# Patient Record
Sex: Female | Born: 1955 | Race: White | Hispanic: No | Marital: Married | Smoking: Never smoker
Health system: Southern US, Community
[De-identification: ages and names within clinical notes are randomized; demographics above are authoritative.]

## PROBLEM LIST (undated history)

## (undated) DIAGNOSIS — K222 Esophageal obstruction: Secondary | ICD-10-CM

## (undated) DIAGNOSIS — E559 Vitamin D deficiency, unspecified: Secondary | ICD-10-CM

## (undated) DIAGNOSIS — K5790 Diverticulosis of intestine, part unspecified, without perforation or abscess without bleeding: Secondary | ICD-10-CM

## (undated) DIAGNOSIS — M810 Age-related osteoporosis without current pathological fracture: Secondary | ICD-10-CM

## (undated) DIAGNOSIS — K219 Gastro-esophageal reflux disease without esophagitis: Secondary | ICD-10-CM

## (undated) DIAGNOSIS — E785 Hyperlipidemia, unspecified: Secondary | ICD-10-CM

## (undated) HISTORY — DX: Esophageal obstruction: K22.2

## (undated) HISTORY — PX: ABDOMINAL HYSTERECTOMY: SHX81

## (undated) HISTORY — PX: WISDOM TOOTH EXTRACTION: SHX21

## (undated) HISTORY — PX: TONSILLECTOMY: SUR1361

## (undated) HISTORY — DX: Age-related osteoporosis without current pathological fracture: M81.0

## (undated) HISTORY — DX: Gastro-esophageal reflux disease without esophagitis: K21.9

## (undated) HISTORY — DX: Hyperlipidemia, unspecified: E78.5

## (undated) HISTORY — DX: Vitamin D deficiency, unspecified: E55.9

## (undated) HISTORY — PX: TONSILLECTOMY AND ADENOIDECTOMY: SUR1326

## (undated) HISTORY — DX: Diverticulosis of intestine, part unspecified, without perforation or abscess without bleeding: K57.90

---

## 1999-11-22 ENCOUNTER — Ambulatory Visit (HOSPITAL_COMMUNITY): Admission: RE | Admit: 1999-11-22 | Discharge: 1999-11-22 | Payer: Self-pay | Admitting: Obstetrics and Gynecology

## 1999-11-22 ENCOUNTER — Encounter: Payer: Self-pay | Admitting: Obstetrics and Gynecology

## 2001-10-29 ENCOUNTER — Ambulatory Visit (HOSPITAL_COMMUNITY): Admission: RE | Admit: 2001-10-29 | Discharge: 2001-10-29 | Payer: Self-pay | Admitting: Obstetrics and Gynecology

## 2001-10-29 ENCOUNTER — Encounter: Payer: Self-pay | Admitting: Obstetrics and Gynecology

## 2002-11-25 ENCOUNTER — Encounter: Payer: Self-pay | Admitting: Internal Medicine

## 2002-11-25 ENCOUNTER — Encounter: Payer: Self-pay | Admitting: Obstetrics and Gynecology

## 2002-11-25 ENCOUNTER — Ambulatory Visit (HOSPITAL_COMMUNITY): Admission: RE | Admit: 2002-11-25 | Discharge: 2002-11-25 | Payer: Self-pay | Admitting: Obstetrics and Gynecology

## 2003-04-24 ENCOUNTER — Encounter: Payer: Self-pay | Admitting: Internal Medicine

## 2003-12-08 ENCOUNTER — Ambulatory Visit (HOSPITAL_COMMUNITY): Admission: RE | Admit: 2003-12-08 | Discharge: 2003-12-08 | Payer: Self-pay | Admitting: Obstetrics and Gynecology

## 2010-10-04 ENCOUNTER — Ambulatory Visit: Payer: Self-pay | Admitting: Internal Medicine

## 2010-10-04 ENCOUNTER — Encounter: Payer: Self-pay | Admitting: Internal Medicine

## 2010-10-04 DIAGNOSIS — M810 Age-related osteoporosis without current pathological fracture: Secondary | ICD-10-CM | POA: Insufficient documentation

## 2010-10-04 DIAGNOSIS — E785 Hyperlipidemia, unspecified: Secondary | ICD-10-CM | POA: Insufficient documentation

## 2010-10-04 LAB — CONVERTED CEMR LAB
Cholesterol, target level: 200 mg/dL
HDL goal, serum: 40 mg/dL
LDL Goal: 160 mg/dL

## 2010-10-05 LAB — CONVERTED CEMR LAB
ALT: 27 units/L (ref 0–35)
AST: 29 units/L (ref 0–37)
Albumin: 4.3 g/dL (ref 3.5–5.2)
Alkaline Phosphatase: 55 units/L (ref 39–117)
BUN: 12 mg/dL (ref 6–23)
Basophils Absolute: 0.1 10*3/uL (ref 0.0–0.1)
Basophils Relative: 0.8 % (ref 0.0–3.0)
Bilirubin, Direct: 0.1 mg/dL (ref 0.0–0.3)
CO2: 29 meq/L (ref 19–32)
Calcium: 9.8 mg/dL (ref 8.4–10.5)
Chloride: 104 meq/L (ref 96–112)
Cholesterol: 232 mg/dL — ABNORMAL HIGH (ref 0–200)
Creatinine, Ser: 0.5 mg/dL (ref 0.4–1.2)
Direct LDL: 125.3 mg/dL
Eosinophils Absolute: 0.2 10*3/uL (ref 0.0–0.7)
Eosinophils Relative: 3.1 % (ref 0.0–5.0)
GFR calc non Af Amer: 132.72 mL/min (ref >60.00)
Glucose, Bld: 85 mg/dL (ref 70–99)
HCT: 39.2 % (ref 36.0–46.0)
HDL: 79.5 mg/dL (ref >39.00)
Hemoglobin: 13.4 g/dL (ref 12.0–15.0)
Lymphocytes Relative: 29.8 % (ref 12.0–46.0)
Lymphs Abs: 2.1 10*3/uL (ref 0.7–4.0)
MCHC: 34.2 g/dL (ref 30.0–36.0)
MCV: 94.9 fL (ref 78.0–100.0)
Monocytes Absolute: 0.6 10*3/uL (ref 0.1–1.0)
Monocytes Relative: 8 % (ref 3.0–12.0)
Neutro Abs: 4.1 10*3/uL (ref 1.4–7.7)
Neutrophils Relative %: 58.3 % (ref 43.0–77.0)
Platelets: 297 10*3/uL (ref 150.0–400.0)
Potassium: 4.4 meq/L (ref 3.5–5.1)
RBC: 4.13 M/uL (ref 3.87–5.11)
RDW: 13.9 % (ref 11.5–14.6)
Sodium: 143 meq/L (ref 135–145)
TSH: 0.9 microintl units/mL (ref 0.35–5.50)
Total Bilirubin: 0.5 mg/dL (ref 0.3–1.2)
Total CHOL/HDL Ratio: 3
Total Protein: 6.9 g/dL (ref 6.0–8.3)
Triglycerides: 41 mg/dL (ref 0.0–149.0)
VLDL: 8.2 mg/dL (ref 0.0–40.0)
WBC: 7 10*3/uL (ref 4.5–10.5)

## 2010-10-07 LAB — CONVERTED CEMR LAB: Vit D, 25-Hydroxy: 51 ng/mL (ref 30–89)

## 2010-10-23 HISTORY — PX: COLONOSCOPY: SHX174

## 2010-11-13 ENCOUNTER — Encounter: Payer: Self-pay | Admitting: Obstetrics and Gynecology

## 2010-11-18 ENCOUNTER — Encounter (INDEPENDENT_AMBULATORY_CARE_PROVIDER_SITE_OTHER): Payer: Self-pay | Admitting: *Deleted

## 2010-11-24 NOTE — Assessment & Plan Note (Signed)
Summary: CPX AND FASTING LABS///SPH   Vital Signs:  Patient profile:   55 year old female Height:      62 inches Weight:      113 pounds BMI:     20.74 Temp:     98.2 degrees F oral Pulse rate:   76 / minute Resp:     14 per minute BP sitting:   128 / 84  (left arm) Cuff size:   regular  Vitals Entered By: Shonna Chock CMA (October 04, 2010 1:17 PM)    History of Present Illness:    Mrs. Robyn Hudson is here for a physical; she is asymptomatic. She is on dietary supplements from [place]; "I don't get tired & overall I feel good".  Lipid Management History:      Positive NCEP/ATP III risk factors include female age 108 years old or older.  Negative NCEP/ATP III risk factors include no history of early menopause without estrogen hormone replacement, non-diabetic, no family history for ischemic heart disease, non-tobacco-user status, non-hypertensive, no ASHD (atherosclerotic heart disease), no prior stroke/TIA, no peripheral vascular disease, and no history of aortic aneurysm.     Preventive Screening-Counseling & Management  Alcohol-Tobacco     Smoking Status: never  Caffeine-Diet-Exercise     Does Patient Exercise: yes  Current Medications (verified): 1)  Estrace 0.5 Mg Tabs (Estradiol) .Marland Kitchen.. 1 By Mouth Once Daily  Allergies (verified): 1)  ! Sulfa  Past History:  Past Medical History: MVP clinically, PMH of . Negative 2D ECHO.  Hyperlipidemia: TC 239, LDL 145, HDL 75, TG 95 in 04/ 2004.LDL 114, HDL 65 in  04/2003. Framingham Study LDL goal = < 160. Phlebitis age 78 on BCP Osteopenia: T score -2.2 LS spine 2004  Past Surgical History: Dehydration post  viral gastroenteritis; G 1 P 1  Tonsillectomy No colonoscopy : "It's not in my family; I see no signs. I just don't want one." ( SOC reviewed)  Family History: Father: COPD, MI @ 69 Mother: negative Siblings: neg; MGM : OA; PGF : CHF  Social History: Never Smoked Alcohol use-no Regular exercise-yes: walking 3 mpd  3X/ week over 57 min Smoking Status:  never Does Patient Exercise:  yes  Review of Systems  The patient denies anorexia, fever, weight loss, weight gain, vision loss, decreased hearing, hoarseness, chest pain, syncope, dyspnea on exertion, peripheral edema, prolonged cough, headaches, abdominal pain, melena, hematochezia, severe indigestion/heartburn, hematuria, suspicious skin lesions, unusual weight change, abnormal bleeding, enlarged lymph nodes, and angioedema.         Appropriate grief response to Jimmy's death.  Physical Exam  General:  Petite  well-nourished;alert,appropriate and cooperative throughout examination Head:  Normocephalic and atraumatic without obvious abnormalities. No apparent alopecia or balding. Eyes:  No corneal or conjunctival inflammation noted.  Perrla. Funduscopic exam benign, without hemorrhages, exudates or papilledema. Ears:  External ear exam shows no significant lesions or deformities.  Otoscopic examination reveals clear canals, tympanic membranes are intact bilaterally without bulging, retraction, inflammation or discharge. Hearing is grossly normal bilaterally. Nose:  External nasal examination shows no deformity or inflammation. Nasal mucosa are pink and moist without lesions or exudates. Mouth:  Oral mucosa and oropharynx without lesions or exudates.  Teeth in good repair; braces. Neck:  No deformities, masses, or tenderness noted. Breasts:  Dr [name] Lungs:  Normal respiratory effort, chest expands symmetrically. Lungs are clear to auscultation, no crackles or wheezes. Heart:  Normal rate and regular rhythm. S1 and S2 normal without gallop, murmur, click, rub .  Abdomen:  Bowel sounds positive,abdomen soft and non-tender without masses, organomegaly or hernias noted. Genitalia:  Dr [name] Msk:  No deformity or scoliosis noted of thoracic or lumbar spine.   Pulses:  R and L carotid,radial,dorsalis pedis and posterior tibial pulses are full and equal  bilaterally Extremities:  No clubbing, cyanosis, edema, or deformity noted with normal full range of motion of all joints.   Neurologic:  alert & oriented X3 and DTRs symmetrical and normal.   Skin:  Intact without suspicious lesions or rashes Cervical Nodes:  No lymphadenopathy noted Axillary Nodes:  No palpable lymphadenopathy Psych:  memory intact for recent and remote, normally interactive, and good eye contact.     Impression & Recommendations:  Problem # 1:  ROUTINE GENERAL MEDICAL EXAM@HEALTH  CARE FACL (ICD-V70.0)  Orders: EKG w/ Interpretation (93000) Venipuncture (40981) TLB-Lipid Panel (80061-LIPID) TLB-BMP (Basic Metabolic Panel-BMET) (80048-METABOL) TLB-CBC Platelet - w/Differential (85025-CBCD) TLB-TSH (Thyroid Stimulating Hormone) (84443-TSH) TLB-Hepatic/Liver Function Pnl (80076-HEPATIC) T-Vitamin D (25-Hydroxy) (19147-82956) Specimen Handling (21308)  Problem # 2:  HYPERLIPIDEMIA (ICD-272.4) LDL goal = < 160  Problem # 3:  OSTEOPENIA (ICD-733.90)  Complete Medication List: 1)  Estrace 0.5 Mg Tabs (Estradiol) .Marland Kitchen.. 1 by mouth once daily  Lipid Assessment/Plan:      Based on NCEP/ATP III, the patient's risk factor category is "0-1 risk factors".  The patient's lipid goals are as follows: Total cholesterol goal is 200; LDL cholesterol goal is 160; HDL cholesterol goal is 40; Triglyceride goal is 150.    Patient Instructions: 1)  Please consider SOC for Colonscopy as we discussed.   Orders Added: 1)  Est. Patient 40-64 years [99396] 2)  EKG w/ Interpretation [93000] 3)  Venipuncture [36415] 4)  TLB-Lipid Panel [80061-LIPID] 5)  TLB-BMP (Basic Metabolic Panel-BMET) [80048-METABOL] 6)  TLB-CBC Platelet - w/Differential [85025-CBCD] 7)  TLB-TSH (Thyroid Stimulating Hormone) [84443-TSH] 8)  TLB-Hepatic/Liver Function Pnl [80076-HEPATIC] 9)  T-Vitamin D (25-Hydroxy) [65784-69629] 10)  Specimen Handling [99000]

## 2010-11-24 NOTE — Letter (Signed)
Summary: Pre Visit Letter Revised  [facility]  [address] [place], [place] [address]   Phone: [phone]  Fax: [phone]        11/18/2010 MRN: [id number] Robyn Hudson [address] [place], [place]  [address]             Procedure Date:  12/22/2010 @ 8:00   Direct colon-Dr. Marina Goodell   Welcome to the Gastroenterology Division at [facility].    You are scheduled to see a nurse for your pre-procedure visit on 12/08/2010 at 8:30 on the 3rd floor at [facility], [address]  We ask that you try to arrive at our office 15 minutes prior to your appointment time to allow for check-in.  Please take a minute to review the attached form.  If you answer "Yes" to one or more of the questions on the first page, we ask that you call the person listed at your earliest opportunity.  If you answer "No" to all of the questions, please complete the rest of the form and bring it to your appointment.    Your nurse visit will consist of discussing your medical and surgical history, your immediate family medical history, and your medications.   If you are unable to list all of your medications on the form, please bring the medication bottles to your appointment and we will list them.  We will need to be aware of both prescribed and over the counter drugs.  We will need to know exact dosage information as well.    Please be prepared to read and sign documents such as consent forms, a financial agreement, and acknowledgement forms.  If necessary, and with your consent, a friend or relative is welcome to sit-in on the nurse visit with you.  Please bring your insurance card so that we may make a copy of it.  If your insurance requires a referral to see a specialist, please bring your referral form from your primary care physician.  No co-pay is required for this nurse visit.     If you cannot keep your appointment, please call [phone] to cancel or reschedule  prior to your appointment date.  This allows [place] the opportunity to schedule an appointment for another patient in need of care.    Thank you for choosing [facility] for your medical needs.  We appreciate the opportunity to care for you.  Please visit [place] at our website  to learn more about our practice.  Sincerely, The Gastroenterology Division

## 2010-12-06 ENCOUNTER — Encounter (INDEPENDENT_AMBULATORY_CARE_PROVIDER_SITE_OTHER): Payer: Self-pay | Admitting: *Deleted

## 2010-12-08 ENCOUNTER — Encounter: Payer: Self-pay | Admitting: Internal Medicine

## 2010-12-14 NOTE — Miscellaneous (Signed)
Summary: [facility] previsit  Clinical Lists Changes  Medications: Added new medication of MOVIPREP 100 GM  SOLR (PEG-KCL-NACL-NASULF-NA ASC-C) As per prep instructions. - Signed Rx of MOVIPREP 100 GM  SOLR (PEG-KCL-NACL-NASULF-NA ASC-C) As per prep instructions.;  #1 x 0;  Signed;  Entered by: Karl Bales RN;  Authorized by: Hilarie Fredrickson MD;  Method used: Electronically to [facility]*, [address] [place], [place]  [address], Ph: [phone] or [phone], Fax: [phone]    Prescriptions: MOVIPREP 100 GM  SOLR (PEG-KCL-NACL-NASULF-NA ASC-C) As per prep instructions.  #1 x 0   Entered by:   Karl Bales RN   Authorized by:   Hilarie Fredrickson MD   Signed by:   Karl Bales RN on 12/08/2010   Method used:   Electronically to        [facility]* (retail)       [address] [place], [place]  [address]       Ph: [phone] or [phone]       Fax: [phone]   RxID:   [id number]

## 2010-12-14 NOTE — Letter (Signed)
Summary: [facility] Instructions  [facility]  [address] [place], [place] [address]   Phone: [phone]  Fax: [phone]       Robyn Hudson    03-Jul-1956    MRN: [id number]        Procedure Day Dorna Bloom:  Lenor Coffin  12/22/10     Arrival Time:  7:30AM     Procedure Time:  8:00AM     Location of Procedure:                    _ X_  [facility] (4th Floor)                      PREPARATION FOR COLONOSCOPY WITH MOVIPREP   Starting 5 days prior to your procedure 12/17/10 do not eat nuts, seeds, popcorn, corn, beans, peas,  salads, or any raw vegetables.  Do not take any fiber supplements (e.g. Metamucil, Citrucel, and Benefiber).  THE DAY BEFORE YOUR PROCEDURE         DATE: 12/21/10   DAY: WEDNESDAY  1.  Drink clear liquids the entire day-NO SOLID FOOD  2.  Do not drink anything colored red or purple.  Avoid juices with pulp.  No orange juice.  3.  Drink at least 64 oz. (8 glasses) of fluid/clear liquids during the day to prevent dehydration and help the prep work efficiently.  CLEAR LIQUIDS INCLUDE: Water Jello Ice Popsicles Tea (sugar ok, no milk/cream) Powdered fruit flavored drinks Coffee (sugar ok, no milk/cream) Gatorade Juice: apple, white grape, white cranberry  Lemonade Clear bullion, consomm, broth Carbonated beverages (any kind) Strained chicken noodle soup Hard Candy                             4.  In the morning, mix first dose of MoviPrep solution:    Empty 1 Pouch A and 1 Pouch B into the disposable container    Add lukewarm drinking water to the top line of the container. Mix to dissolve    Refrigerate (mixed solution should be used within 24 hrs)  5.  Begin drinking the prep at 5:00 p.m. The MoviPrep container is divided by 4 marks.   Every 15 minutes drink the solution down to the next mark (approximately 8 oz) until the full liter is complete.   6.  Follow completed prep with 16 oz of clear liquid of your choice  (Nothing red or purple).  Continue to drink clear liquids until bedtime.  7.  Before going to bed, mix second dose of MoviPrep solution:    Empty 1 Pouch A and 1 Pouch B into the disposable container    Add lukewarm drinking water to the top line of the container. Mix to dissolve    Refrigerate  THE DAY OF YOUR PROCEDURE      DATE:  12/22/10   DAY: THURSDAY  Beginning at 3:00AM (5 hours before procedure):         1. Every 15 minutes, drink the solution down to the next mark (approx 8 oz) until the full liter is complete.  2. Follow completed prep with 16 oz. of clear liquid of your choice.    3. You may drink clear liquids until 6:00AM (2 HOURS BEFORE PROCEDURE).   MEDICATION INSTRUCTIONS  Unless otherwise instructed, you should take regular prescription medications with a small sip of water   as early as possible the  morning of your procedure.         OTHER INSTRUCTIONS  You will need a responsible adult at least 56 years of age to accompany you and drive you home.   This person must remain in the waiting room during your procedure.  Wear loose fitting clothing that is easily removed.  Leave jewelry and other valuables at home.  However, you may wish to bring a book to read or  an iPod/MP3 player to listen to music as you wait for your procedure to start.  Remove all body piercing jewelry and leave at home.  Total time from sign-in until discharge is approximately 2-3 hours.  You should go home directly after your procedure and rest.  You can resume normal activities the  day after your procedure.  The day of your procedure you should not:   Drive   Make legal decisions   Operate machinery   Drink alcohol   Return to work  You will receive specific instructions about eating, activities and medications before you leave.    The above instructions have been reviewed and explained to me by  Karl Bales RN  December 08, 2010 8:34 AM    I fully  understand and can verbalize these instructions _____________________________ Date _________

## 2010-12-22 ENCOUNTER — Other Ambulatory Visit (AMBULATORY_SURGERY_CENTER): Payer: BC Managed Care – PPO | Admitting: Internal Medicine

## 2010-12-22 ENCOUNTER — Encounter: Payer: Self-pay | Admitting: Internal Medicine

## 2010-12-22 DIAGNOSIS — K5730 Diverticulosis of large intestine without perforation or abscess without bleeding: Secondary | ICD-10-CM

## 2010-12-22 DIAGNOSIS — Z1211 Encounter for screening for malignant neoplasm of colon: Secondary | ICD-10-CM

## 2010-12-29 NOTE — Procedures (Addendum)
Summary: Colonoscopy  Patient: [name] Note: All result statuses are Final unless otherwise noted.  Tests: (1) Colonoscopy (COL)   COL Colonoscopy           DONE     [facility]     [address]     [place], [place]  [address]          COLONOSCOPY PROCEDURE REPORT          PATIENT:  Robyn Hudson, Robyn Hudson  MR#:  [id number]     BIRTHDATE:  [birth year], 54 yrs. old  GENDER:  female     ENDOSCOPIST:  Wilhemina Bonito. Eda Keys, MD     REF. BY:  Marga Melnick, M.D.     PROCEDURE DATE:  12/22/2010     PROCEDURE:  Average-risk screening colonoscopy     G0121     ASA CLASS:  Class I     INDICATIONS:  Routine Risk Screening     MEDICATIONS:   Fentanyl 100 mcg IV, Versed 9 mg IV          DESCRIPTION OF PROCEDURE:   After the risks benefits and     alternatives of the procedure were thoroughly explained, informed     consent was obtained.  Digital rectal exam was performed and     revealed no abnormalities.   The LB [id number] endoscope     was introduced through the anus and advanced to the cecum, which     was identified by both the appendix and ileocecal valve, without     limitations.Time to cecum = 2:57 min.  The quality of the prep was     excellent, using MoviPrep.  The instrument was then slowly     withdrawn (time = 9:33 min)as the colon was fully examined.     <<PROCEDUREIMAGES>>          FINDINGS:  Moderate diverticulosis was found throughout the colon.     Otherwise normal colonoscopy without polyps, masses, vascular     ectasias, or inflammatory changes.   Retroflexed views in the     rectum revealed no abnormalities.    The scope was then withdrawn     from the patient and the procedure completed.          COMPLICATIONS:  None          ENDOSCOPIC IMPRESSION:     1) Moderate diverticulosis throughout the colon     2) Otherwise normal colonoscopy          RECOMMENDATIONS:     1) Continue current colorectal screening recommendations for     "routine  risk" patients with a repeat colonoscopy in 10 years.          ______________________________     Wilhemina Bonito. Eda Keys, MD          CC:  Pecola Lawless, MD;   The Patient          n.     eSIGNED:   Wilhemina Bonito. Eda Keys at 12/22/2010 08:41 AM          Angelina Pih, [id number]  Note: An exclamation mark (!) indicates a result that was not dispersed into the flowsheet. Document Creation Date: 12/22/2010 8:42 AM _______________________________________________________________________  (1) Order result status: Final Collection or observation date-time: 12/22/2010 08:34 Requested date-time:  Receipt date-time:  Reported date-time:  Referring Physician:   Ordering Physician: Fransico Setters [phone]) Specimen Source:  Source: Launa Grill Order Number: [id number] Lab  site:   Appended Document: Colonoscopy    Clinical Lists Changes  Observations: Added new observation of COLONNXTDUE: 12/2020 (12/22/2010 13:57)      Appended Document: Colonoscopy     Procedures Next Due Date:    Colonoscopy: 12/2020

## 2011-10-23 ENCOUNTER — Emergency Department (INDEPENDENT_AMBULATORY_CARE_PROVIDER_SITE_OTHER)
Admission: EM | Admit: 2011-10-23 | Discharge: 2011-10-23 | Disposition: A | Discharge status: Home / Self Care | Payer: BC Managed Care – PPO | Source: Home / Self Care | Attending: Emergency Medicine | Admitting: Emergency Medicine

## 2011-10-23 DIAGNOSIS — J329 Chronic sinusitis, unspecified: Secondary | ICD-10-CM

## 2011-10-23 MED ORDER — FLUTICASONE PROPIONATE 50 MCG/ACT NA SUSP: 2.0000 | Freq: Every day | NASAL | Status: DC

## 2011-10-23 MED ORDER — AMOXICILLIN-POT CLAVULANATE 875-125 MG PO TABS: 1.0000 | ORAL_TABLET | Freq: Two times a day (BID) | ORAL | Status: AC

## 2011-10-23 MED ORDER — PSEUDOEPHEDRINE-GUAIFENESIN ER 120-1200 MG PO TB12: 1.0000 | ORAL_TABLET | Freq: Two times a day (BID) | ORAL | Status: DC

## 2011-10-23 NOTE — ED Provider Notes (Signed)
History     CSN: [id number]  Arrival date & time 10/23/11  1859   First MD Initiated Contact with Patient 10/23/11 1904      Chief Complaint  Patient presents with  . Sinusitis    2 week hx of sinus pressure with greenish nasal drainage. Had fever this afternoon.      (Consider location/radiation/quality/duration/timing/severity/associated sxs/prior treatment) HPI Comments: Pt with nasal congestion, ST, nonproductive cough, bilateral  Frontal and maxillary sinus pain/pressure worse with bending forward/lying down, ear fullness, purulent nasal d/c x 2 weeks. Felt feverish today took 600 mg ibu and temp was 99.  No  N/V, other HA, bodyaches, dental pain. Taking otc cold meds w/o relief. States feels simlar to prev episodes of sinusitis.    Patient is a 55 y.o. female presenting with sinusitis. The history is provided by the patient.  Sinusitis  This is a new problem. The current episode started more than 1 week ago. The problem has been gradually worsening. Associated symptoms include congestion, ear pain, sinus pressure and sore throat. Pertinent negatives include no chills.    Past Medical History  Diagnosis Date  . Sinusitis     Past Surgical History  Procedure Date  . Abdominal hysterectomy     History reviewed. No pertinent family history.  History  Substance Use Topics  . Smoking status: Never Smoker   . Smokeless tobacco: Never Used  . Alcohol Use: No    OB History    Grav Para Term Preterm Abortions TAB SAB Ect Mult Living                  Review of Systems  Constitutional: Positive for fatigue. Negative for chills.  HENT: Positive for ear pain, congestion, sore throat, postnasal drip and sinus pressure. Negative for facial swelling, trouble swallowing and dental problem.   Gastrointestinal: Negative.   Neurological: Positive for headaches.    Allergies  Sulfonamide derivatives  Home Medications   Current Outpatient Rx  Name Route Sig Dispense  Refill  . ESTRADIOL 0.5 MG PO TABS Oral Take 0.5 mg by mouth daily.      . IBUPROFEN 600 MG PO TABS Oral Take 600 mg by mouth every 6 (six) hours as needed.      . MULTI-VITAMIN/MINERALS PO TABS Oral Take 1 tablet by mouth daily.      . AMOXICILLIN-POT CLAVULANATE 875-125 MG PO TABS Oral Take 1 tablet by mouth 2 (two) times daily. X 7 days 14 tablet 0  . FLUTICASONE PROPIONATE 50 MCG/ACT NA SUSP Nasal Place 2 sprays into the nose daily. 16 g 0  . PSEUDOEPHEDRINE-GUAIFENESIN ER [phone] MG PO TB12 Oral Take 1 tablet by mouth 2 (two) times daily. 20 each 0    BP 119/92  Pulse 91  Temp(Src) 98.7 F (37.1 C) (Oral)  Resp 18  SpO2 100%  Physical Exam  Nursing note and vitals reviewed. Constitutional: She is oriented to person, place, and time. She appears well-developed and well-nourished. No distress.  HENT:  Head: Normocephalic and atraumatic.  Right Ear: Tympanic membrane normal.  Left Ear: Tympanic membrane normal.  Nose: Mucosal edema present. No rhinorrhea. Right sinus exhibits frontal sinus tenderness. Left sinus exhibits frontal sinus tenderness.  Mouth/Throat: Uvula is midline, oropharynx is clear and moist and mucous membranes are normal.       Purulent nasal discharge  Eyes: EOM are normal. Pupils are equal, round, and reactive to light.  Neck: Normal range of motion.  Cardiovascular: Regular rhythm.  Pulmonary/Chest: Effort normal and breath sounds normal.  Abdominal: She exhibits no distension.  Musculoskeletal: Normal range of motion.  Lymphadenopathy:    She has no cervical adenopathy.  Neurological: She is alert and oriented to person, place, and time.  Skin: Skin is warm and dry.  Psychiatric: She has a normal mood and affect. Her behavior is normal. Judgment and thought content normal.    ED Course  Procedures (including critical care time)  Labs Reviewed - No data to display No results found.   1. Sinusitis       MDM  Has had sx for 2 weeks despite  usual home care.  sending home with augmentin, flonase, mucinex d   Luiz Blare, MD 10/23/11 2102

## 2011-10-23 NOTE — ED Notes (Signed)
2 week hx of sinus pressure, sinus headaches.  Today had fever.  Has used tylenol, ibuprofen, Nyquil.

## 2012-01-30 ENCOUNTER — Telehealth: Payer: Self-pay | Admitting: Internal Medicine

## 2012-01-30 NOTE — Telephone Encounter (Signed)
Would you be ok with doing this Patients PAP only? She is normally Dr. Caryl Never patient but would like to get her gynecology needs here as her current GYN has retired. Please review & advise

## 2012-01-30 NOTE — Telephone Encounter (Signed)
Ok w/ pap only- pt must be aware no additional concerns and will have CPE done w/ Dr Alwyn Ren

## 2012-01-30 NOTE — Telephone Encounter (Signed)
I have advised patient she would see you only for PAP all other issues and medical treatment would be solely with Dr. Alwyn Ren, patient understood.

## 2012-01-30 NOTE — Telephone Encounter (Signed)
Dr.Hopper is totally ok with that request

## 2012-01-30 NOTE — Telephone Encounter (Signed)
Patient called & would like to have her PAP Smear done here. Please advise if you are OK with her seeing Dr. Beverely Low for this issue only? Thanks

## 2012-03-05 ENCOUNTER — Ambulatory Visit (INDEPENDENT_AMBULATORY_CARE_PROVIDER_SITE_OTHER): Payer: BC Managed Care – PPO | Admitting: Family Medicine

## 2012-03-05 ENCOUNTER — Encounter: Payer: Self-pay | Admitting: Family Medicine

## 2012-03-05 VITALS — BP 118/78 | HR 84 | Temp 97.9°F | Ht 61.0 in | Wt 113.8 lb

## 2012-03-05 DIAGNOSIS — Z01419 Encounter for gynecological examination (general) (routine) without abnormal findings: Secondary | ICD-10-CM | POA: Insufficient documentation

## 2012-03-05 MED ORDER — CILIDINIUM-CHLORDIAZEPOXIDE 2.5-5 MG PO CAPS: 1.0000 | ORAL_CAPSULE | Freq: Three times a day (TID) | ORAL | Status: DC | PRN

## 2012-03-05 NOTE — Assessment & Plan Note (Signed)
Pt's breast and pelvic exam WNL.  UTD on mammo and DEXA.  No need for additional paps as pt is s/p hysterectomy.  Reviewed these recommendations w/ pt and she is agreeable to stopping testing.  Pt would like to attempt to wean off HRT- agreeable to this.  Pt to call if this is too difficulty.  Anticipatory guidance provided.

## 2012-03-05 NOTE — Patient Instructions (Signed)
Schedule your complete physical w/ [facility] Stop the Estrace- this is becomes problematic for you, we can restart Keep up the good work- you look great! Call with any questions or concerns Have a great summer!

## 2012-03-05 NOTE — Progress Notes (Signed)
  Subjective:    Patient ID: Robyn Hudson, female    DOB: 1956/05/23, 56 y.o.   MRN: [id number]  HPI GYN- Robyn Hudson (retired).  Last pap was 12/11.  S/p hysterectomy due to endometriosis.  1 ovary remains.  Has never had abnormal pap.  Gets yearly mammo.  On low dose Estrace for osteopenia.  Due for DEXA 2014.  No concerns today.   Review of Systems Denies vaginal bleeding, D/C, pain, dryness, bloating, early satiety, breast lump, breast drainage, hot flashes, mood swings, changes to hair/nails.    Objective:   Physical Exam  Vitals reviewed. Constitutional: She appears well-developed and well-nourished. No distress.  Genitourinary: Vagina normal. No breast swelling, tenderness, discharge or bleeding. There is no rash, tenderness or lesion on the right labia. There is no rash, tenderness or lesion on the left labia. Right adnexum displays no mass, no tenderness and no fullness. Left adnexum displays no mass, no tenderness and no fullness. No erythema, tenderness or bleeding around the vagina. No vaginal discharge found.       Cervix and uterus surgically absent Rectum externally normal  Skin: Skin is warm and dry.  Psychiatric: She has a normal mood and affect. Her behavior is normal. Thought content normal.          Assessment & Plan:

## 2012-03-13 ENCOUNTER — Encounter: Payer: Self-pay | Admitting: Family Medicine

## 2012-10-29 ENCOUNTER — Telehealth: Payer: Self-pay | Admitting: *Deleted

## 2012-10-29 DIAGNOSIS — K589 Irritable bowel syndrome without diarrhea: Secondary | ICD-10-CM

## 2012-10-29 MED ORDER — CILIDINIUM-CHLORDIAZEPOXIDE 2.5-5 MG PO CAPS: 1.0000 | ORAL_CAPSULE | Freq: Three times a day (TID) | ORAL | Status: DC | PRN

## 2012-10-29 NOTE — Telephone Encounter (Signed)
Refill for librax sent to [facility] per pt request.

## 2013-01-22 ENCOUNTER — Ambulatory Visit (INDEPENDENT_AMBULATORY_CARE_PROVIDER_SITE_OTHER): Payer: BC Managed Care – PPO | Admitting: Internal Medicine

## 2013-01-22 ENCOUNTER — Encounter: Payer: Self-pay | Admitting: Internal Medicine

## 2013-01-22 VITALS — BP 118/68 | HR 70 | Resp 12 | Ht 62.0 in | Wt 109.0 lb

## 2013-01-22 DIAGNOSIS — M899 Disorder of bone, unspecified: Secondary | ICD-10-CM

## 2013-01-22 DIAGNOSIS — Z0000 Encounter for general adult medical examination without abnormal findings: Secondary | ICD-10-CM

## 2013-01-22 DIAGNOSIS — Z23 Encounter for immunization: Secondary | ICD-10-CM

## 2013-01-22 DIAGNOSIS — M949 Disorder of cartilage, unspecified: Secondary | ICD-10-CM

## 2013-01-22 LAB — BASIC METABOLIC PANEL
Calcium: 9.3 mg/dL (ref 8.4–10.5)
GFR: 103.15 mL/min (ref >60.00)
Glucose, Bld: 80 mg/dL (ref 70–99)
Sodium: 140 mEq/L (ref 135–145)

## 2013-01-22 LAB — HEPATIC FUNCTION PANEL
AST: 25 U/L (ref 0–37)
Alkaline Phosphatase: 59 U/L (ref 39–117)
Total Bilirubin: 0.6 mg/dL (ref 0.3–1.2)

## 2013-01-22 LAB — CBC WITH DIFFERENTIAL/PLATELET
Basophils Absolute: 0 10*3/uL (ref 0.0–0.1)
Eosinophils Relative: 3.8 % (ref 0.0–5.0)
Hemoglobin: 12.7 g/dL (ref 12.0–15.0)
Lymphocytes Relative: 30.4 % (ref 12.0–46.0)
Monocytes Relative: 8.3 % (ref 3.0–12.0)
Neutro Abs: 3.9 10*3/uL (ref 1.4–7.7)
RDW: 14.1 % (ref 11.5–14.6)
WBC: 6.8 10*3/uL (ref 4.5–10.5)

## 2013-01-22 LAB — LIPID PANEL
Total CHOL/HDL Ratio: 3
VLDL: 11.4 mg/dL (ref 0.0–40.0)

## 2013-01-22 NOTE — Progress Notes (Signed)
  Subjective:    Patient ID: NOTNAMED CROUCHER, female    DOB: September 26, 1956, 57 y.o.   MRN: [id number]  HPI  She  is here for a physical; she denies acute issues .     Review of Systems She is on a modified heart healthy diet; she exercises as walking on her job without symptoms. Specifically she denies chest pain, palpitations, dyspnea, or claudication. Family history is negative for premature coronary disease.Total cholesterol is elevated due to protective HDL.     Objective:   Physical Exam Gen.: Thin but healthy and well-nourished in appearance. Alert, appropriate and cooperative throughout exam.Appears younger than stated age  Head: Normocephalic without obvious abnormalities  Eyes: No corneal or conjunctival inflammation noted. Pupils equal round reactive to light and accommodation. Fundal exam is benign without hemorrhages, exudate, papilledema. Extraocular motion intact. Vision grossly normal with lenses Ears: External  ear exam reveals no significant lesions or deformities. Canals clear .TMs normal. Hearing is grossly normal bilaterally. Nose: External nasal exam reveals no deformity or inflammation. Nasal mucosa are pink and moist. No lesions or exudates noted. Septum  Minimally dislocated.  Mouth: Oral mucosa and oropharynx reveal no lesions or exudates. Teeth in good repair. Neck: No deformities, masses, or tenderness noted. Range of motion minimally decreased.Thyroid normal. Lungs: Normal respiratory effort; chest expands symmetrically. Lungs are clear to auscultation without rales, wheezes, or increased work of breathing. Heart: Normal rate and rhythm. Normal S1 and S2. No gallop, click, or rub.S4 w/o murmur. Abdomen: Bowel sounds normal; abdomen soft and nontender. No masses, organomegaly or hernias noted.Aorta palpable ; no AAA Genitalia: up to date                         Musculoskeletal/extremities:There is minimal asymmetry of the posterior thoracic musculature suggesting  occult scoliosis. No clubbing, cyanosis, edema, or significant extremity  deformity noted. Range of motion normal .Tone & strength normal. Joints normal. Nail health good. Able to lie down & sit up w/o help. Negative SLR bilaterally Vascular: Carotid, radial artery, dorsalis pedis and  posterior tibial pulses are full and equal. No bruits present. Neurologic: Alert and oriented x3. Deep tendon reflexes symmetrical and normal.      Skin: Intact without suspicious lesions or rashes. Lymph: No cervical, axillary lymphadenopathy present. Psych: Mood and affect are normal. Normally interactive                                                                                        Assessment & Plan:  #1 comprehensive physical exam; no acute findings  Plan: see Orders  & Recommendations

## 2013-01-22 NOTE — Patient Instructions (Addendum)
Preventive Health Care: Exercise  30-45  minutes a day, 3-4 days a week. Walking is especially valuable in preventing Osteoporosis. Eat a low-fat diet with lots of fruits and vegetables, up to 7-9 servings per day. This would eliminate need for vitamin supplements for most individuals. Consume less than 30 grams of sugar per day from foods & drinks with High Fructose Corn Syrup as #2,3 or #4 on label. If you activate the  My Chart system; lab & Xray results will be released directly  to you as soon as I review & address these through the computer. If you choose not to sign up for My Chart within 36 hours of labs being drawn; results will be reviewed & interpretation added before being copied & mailed, causing a delay in getting the results to you.If you do not receive that report within 7-10 days ,please call. Additionally you can use this system to gain direct  access to your records  if  out of town or @ an office of a  physician who is not in  the My Chart network.  This improves continuity of care & places you in control of your medical record.

## 2013-01-26 LAB — VITAMIN D 1,25 DIHYDROXY
Vitamin D 1, 25 (OH)2 Total: 24 pg/mL (ref 18–72)
Vitamin D3 1, 25 (OH)2: 24 pg/mL

## 2013-01-27 ENCOUNTER — Encounter: Payer: Self-pay | Admitting: Internal Medicine

## 2013-03-18 ENCOUNTER — Encounter: Payer: Self-pay | Admitting: Internal Medicine

## 2013-03-20 ENCOUNTER — Telehealth: Payer: Self-pay

## 2013-03-20 NOTE — Telephone Encounter (Signed)
Spoke with patient, patient scheduled appointment for 04/02/2013 to discuss bone density, patient will double check that her schedule is free and call back if appointment needs to be rescheduled .

## 2013-03-20 NOTE — Telephone Encounter (Signed)
Left message on voicemail for patient to return call, reason for call: Patient needs to schedule an appointment to discuss BMD

## 2013-03-25 ENCOUNTER — Encounter: Payer: Self-pay | Admitting: Internal Medicine

## 2013-04-02 ENCOUNTER — Encounter: Payer: Self-pay | Admitting: Internal Medicine

## 2013-04-02 ENCOUNTER — Ambulatory Visit (INDEPENDENT_AMBULATORY_CARE_PROVIDER_SITE_OTHER): Payer: BC Managed Care – PPO | Admitting: Internal Medicine

## 2013-04-02 VITALS — BP 112/68 | HR 73 | Wt 107.0 lb

## 2013-04-02 DIAGNOSIS — E559 Vitamin D deficiency, unspecified: Secondary | ICD-10-CM | POA: Insufficient documentation

## 2013-04-02 DIAGNOSIS — M810 Age-related osteoporosis without current pathological fracture: Secondary | ICD-10-CM

## 2013-04-02 NOTE — Patient Instructions (Addendum)
Cardiovascular exercise such as walking is recommended 30-45 minutes 3-4 times per week. If you're not exercising you should take 6-8 weeks to build up to this level. Recheck vitamin D. level in 6 months; continued the vitamin D 3000 international units 2 pills daily and this supplement which is 7050 mg of calcium and 300 international units of vitamin D 3. BMD in 25 months.

## 2013-04-02 NOTE — Progress Notes (Signed)
  Subjective:    Patient ID: Robyn Hudson, female    DOB: February 03, 1956, 57 y.o.   MRN: [id number]  HPI  The bone density performed 03/11/13 was reviewed and the pathophysiology of osteoporosis was discussed. 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 The lowest T score at the hip is -1.5. Her risk of major osteoporotic fractures is 6.8% and 0.6% at the hip.  She is very physically active but is not involved in a walking program.  She was found to have vitamin D deficiency in April with a vitamin D level of 24. She is not on calcium supplementation either.     Review of Systems  She describes intermittent pain in the right hip mainly when she is lying in RLD. She rarely takes any medication for this. It does not bother her during her physical activity at work.     Objective:   Physical Exam  She appears healthy and well-nourished; she is thin.  She has minimal crepitus of the left shoulder and both knees.  No patellar effusions are present.  There is full range of motion of the right hip with no evidence of impairment. This does not initiate pain.  She does have some decreased range of motion of the neck.  She has no significant degenerative changes of the hands          Assessment & Plan:  #1 osteoporosis with some increase risk. This is in the context of lack of weight bearing exercises and vitamin D deficiency.  #2 right hip pain may be a piriformis syndrome.  Plan: Initiate walking program and optimize vitamin D supplementation. Recheck vitamin D level in 6 months. Monitor bone density and 25 months.  Arthritis strength Tylenol is recommended at bedtime for the hip. She should consider a new mattress if symptoms persist or progress

## 2013-08-28 ENCOUNTER — Other Ambulatory Visit: Payer: Self-pay

## 2013-10-10 ENCOUNTER — Other Ambulatory Visit (INDEPENDENT_AMBULATORY_CARE_PROVIDER_SITE_OTHER): Payer: BC Managed Care – PPO

## 2013-10-10 DIAGNOSIS — E559 Vitamin D deficiency, unspecified: Secondary | ICD-10-CM

## 2013-10-14 ENCOUNTER — Encounter: Payer: Self-pay | Admitting: Internal Medicine

## 2014-02-19 ENCOUNTER — Other Ambulatory Visit: Payer: Self-pay | Admitting: Family Medicine

## 2014-02-19 ENCOUNTER — Other Ambulatory Visit: Payer: Self-pay

## 2014-02-19 MED ORDER — CILIDINIUM-CHLORDIAZEPOXIDE 2.5-5 MG PO CAPS: ORAL_CAPSULE | ORAL | Status: DC

## 2014-02-19 NOTE — Telephone Encounter (Signed)
OK X1 

## 2014-02-19 NOTE — Telephone Encounter (Signed)
[facility]

## 2014-03-18 ENCOUNTER — Other Ambulatory Visit: Payer: Self-pay | Admitting: *Deleted

## 2014-03-18 DIAGNOSIS — I83893 Varicose veins of bilateral lower extremities with other complications: Secondary | ICD-10-CM

## 2014-03-31 ENCOUNTER — Encounter: Payer: Self-pay | Admitting: Family Medicine

## 2014-04-23 ENCOUNTER — Ambulatory Visit (INDEPENDENT_AMBULATORY_CARE_PROVIDER_SITE_OTHER): Payer: BC Managed Care – PPO | Admitting: Internal Medicine

## 2014-04-23 ENCOUNTER — Encounter: Payer: Self-pay | Admitting: Surgery

## 2014-04-23 ENCOUNTER — Other Ambulatory Visit (INDEPENDENT_AMBULATORY_CARE_PROVIDER_SITE_OTHER): Payer: BC Managed Care – PPO

## 2014-04-23 ENCOUNTER — Encounter: Payer: Self-pay | Admitting: Internal Medicine

## 2014-04-23 VITALS — BP 120/80 | HR 76 | Temp 98.1°F | Ht 61.0 in | Wt 107.6 lb

## 2014-04-23 DIAGNOSIS — R21 Rash and other nonspecific skin eruption: Secondary | ICD-10-CM

## 2014-04-23 DIAGNOSIS — E559 Vitamin D deficiency, unspecified: Secondary | ICD-10-CM

## 2014-04-23 DIAGNOSIS — J309 Allergic rhinitis, unspecified: Secondary | ICD-10-CM

## 2014-04-23 DIAGNOSIS — E785 Hyperlipidemia, unspecified: Secondary | ICD-10-CM

## 2014-04-23 DIAGNOSIS — Z0000 Encounter for general adult medical examination without abnormal findings: Secondary | ICD-10-CM

## 2014-04-23 DIAGNOSIS — M810 Age-related osteoporosis without current pathological fracture: Secondary | ICD-10-CM

## 2014-04-23 LAB — CBC WITH DIFFERENTIAL/PLATELET
Basophils Absolute: 0 10*3/uL (ref 0.0–0.1)
Basophils Relative: 0.4 % (ref 0.0–3.0)
Eosinophils Absolute: 0.3 10*3/uL (ref 0.0–0.7)
Eosinophils Relative: 5.5 % — ABNORMAL HIGH (ref 0.0–5.0)
HCT: 38.1 % (ref 36.0–46.0)
Hemoglobin: 12.9 g/dL (ref 12.0–15.0)
Lymphocytes Relative: 31.6 % (ref 12.0–46.0)
Lymphs Abs: 1.9 10*3/uL (ref 0.7–4.0)
MCHC: 34 g/dL (ref 30.0–36.0)
MCV: 91.3 fl (ref 78.0–100.0)
Monocytes Absolute: 0.5 10*3/uL (ref 0.1–1.0)
Monocytes Relative: 7.9 % (ref 3.0–12.0)
Neutro Abs: 3.2 10*3/uL (ref 1.4–7.7)
Neutrophils Relative %: 54.6 % (ref 43.0–77.0)
Platelets: 312 10*3/uL (ref 150.0–400.0)
RBC: 4.17 Mil/uL (ref 3.87–5.11)
RDW: 14.1 % (ref 11.5–15.5)
WBC: 5.9 10*3/uL (ref 4.0–10.5)

## 2014-04-23 LAB — BASIC METABOLIC PANEL
BUN: 12 mg/dL (ref 6–23)
CALCIUM: 9.9 mg/dL (ref 8.4–10.5)
CO2: 30 mEq/L (ref 19–32)
CREATININE: 0.6 mg/dL (ref 0.4–1.2)
Chloride: 106 mEq/L (ref 96–112)
GFR: 108.65 mL/min (ref >60.00)
Glucose, Bld: 94 mg/dL (ref 70–99)
Potassium: 4.3 mEq/L (ref 3.5–5.1)
SODIUM: 142 meq/L (ref 135–145)

## 2014-04-23 LAB — LIPID PANEL
CHOL/HDL RATIO: 3
Cholesterol: 232 mg/dL — ABNORMAL HIGH (ref 0–200)
HDL: 89.4 mg/dL (ref >39.00)
LDL Cholesterol: 133 mg/dL — ABNORMAL HIGH (ref 0–99)
NONHDL: 142.6
Triglycerides: 47 mg/dL (ref 0.0–149.0)
VLDL: 9.4 mg/dL (ref 0.0–40.0)

## 2014-04-23 LAB — HEPATIC FUNCTION PANEL
ALBUMIN: 4.3 g/dL (ref 3.5–5.2)
ALK PHOS: 72 U/L (ref 39–117)
ALT: 25 U/L (ref 0–35)
AST: 29 U/L (ref 0–37)
BILIRUBIN DIRECT: 0.1 mg/dL (ref 0.0–0.3)
TOTAL PROTEIN: 7 g/dL (ref 6.0–8.3)
Total Bilirubin: 0.5 mg/dL (ref 0.2–1.2)

## 2014-04-23 LAB — TSH: TSH: 0.8 u[IU]/mL (ref 0.35–4.50)

## 2014-04-23 MED ORDER — MOMETASONE FUROATE 0.1 % EX OINT: TOPICAL_OINTMENT | Freq: Two times a day (BID) | CUTANEOUS | Status: DC

## 2014-04-23 NOTE — Progress Notes (Signed)
Pre visit review using our clinic review tool, if applicable. No additional management support is needed unless otherwise documented below in the visit note. 

## 2014-04-23 NOTE — Progress Notes (Signed)
   Subjective:    Patient ID: Robyn Hudson, female    DOB: 06-09-1956, 58 y.o.   MRN: [id number]  HPI  She is here for a physical;acute issues denied. She has remarried & is very happy.     Review of Systems   After exposure to a closed beach house she's developed some rhinoconjunctivitis symptoms with itchy, watery eyes. She's also had some rash of her fingers.     Objective:   Physical Exam  Gen.: Thin but healthy and well-nourished in appearance. Alert, appropriate and cooperative throughout exam. Appears younger than stated age  Head: Normocephalic without obvious abnormalities. Eyes: No corneal or conjunctival inflammation noted. Pupils equal round reactive to light and accommodation. Extraocular motion intact.  Ears: External  ear exam reveals no significant lesions or deformities. Canals clear .TMs normal. Hearing is grossly normal bilaterally. Nose: External nasal exam reveals no deformity or inflammation. Nasal mucosa are pink and moist. No lesions or exudates noted.   Mouth: Oral mucosa and oropharynx reveal no lesions or exudates. Teeth in good repair. Neck: No deformities, masses, or tenderness noted. Range of motion decreased. Thyroid normal. Lungs: Normal respiratory effort; chest expands symmetrically. Lungs are clear to auscultation without rales, wheezes, or increased work of breathing. Heart: Normal rate and rhythm. Normal S1 and S2. No gallop, click, or rub. No murmur. Abdomen: Bowel sounds normal; abdomen soft and nontender. No masses, organomegaly or hernias noted. Genitalia:  UTD                               Musculoskeletal/extremities: No deformity or scoliosis noted of  the thoracic or lumbar spine.  No clubbing, cyanosis, edema, or significant extremity  deformity noted. Range of motion normal .Tone & strength normal. 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 Minor knee crepitus. Fingernail / toenail health good. Able to lie down & sit up w/o help.  Negative SLR bilaterally Vascular: Carotid, radial artery, dorsalis pedis and  posterior tibial pulses are full and equal. No bruits present. Neurologic: Alert and oriented x3. Deep tendon reflexes symmetrical and normal.  Gait normal.       Skin: Intertriginous erythematous eczematoid rash.She has  lesions of the left lower extremity &  the left upper extremity which are slightly erythematous and rough. Lymph: No cervical, axillary lymphadenopathy present. Psych: Mood and affect are normal. Normally interactive                                                                                        Assessment & Plan:  #1 comprehensive physical exam; no acute findings #2 extremity lesions which should be assessed by Derm if no better with generic Elocon #3 extrinsic rhinoconjunctivitis  Plan: see Orders  & Recommendations

## 2014-04-23 NOTE — Patient Instructions (Signed)
Your next office appointment will be determined based upon review of your pending labs . Those instructions will be transmitted to you through My Chart   Plain Mucinex (NOT D) for thick secretions ;force NON dairy fluids .   Nasal cleansing in the shower as discussed with lather of mild shampoo.After 10 seconds wash off lather while  exhaling through nostrils. Make sure that all residual soap is removed to prevent irritation.  Flonase OR Nasacort AQ 1 spray in each nostril twice a day as needed. Use the "crossover" technique into opposite nostril spraying toward opposite ear @ 45 degree angle, not straight up into nostril.  Use a Neti pot daily only  as needed for significant sinus congestion; going from open side to congested side . Plain Allegra (NOT D )  160 daily , Loratidine 10 mg , OR Zyrtec 10 mg @ bedtime  as needed for itchy eyes & sneezing.

## 2014-04-24 ENCOUNTER — Encounter: Payer: Self-pay | Admitting: Internal Medicine

## 2014-04-27 ENCOUNTER — Ambulatory Visit (HOSPITAL_COMMUNITY)
Admission: RE | Admit: 2014-04-27 | Discharge: 2014-04-27 | Disposition: A | Discharge status: Home / Self Care | Payer: BC Managed Care – PPO | Source: Ambulatory Visit | Attending: Surgery | Admitting: Surgery

## 2014-04-27 ENCOUNTER — Ambulatory Visit (INDEPENDENT_AMBULATORY_CARE_PROVIDER_SITE_OTHER): Payer: BC Managed Care – PPO | Admitting: Surgery

## 2014-04-27 ENCOUNTER — Encounter: Payer: Self-pay | Admitting: Surgery

## 2014-04-27 VITALS — BP 130/74 | HR 83 | Ht 61.0 in | Wt 107.6 lb

## 2014-04-27 DIAGNOSIS — I83893 Varicose veins of bilateral lower extremities with other complications: Secondary | ICD-10-CM

## 2014-04-27 DIAGNOSIS — I8393 Asymptomatic varicose veins of bilateral lower extremities: Secondary | ICD-10-CM | POA: Insufficient documentation

## 2014-04-27 NOTE — Progress Notes (Signed)
Patient name: Robyn Hudson MRN: [id number] DOB: [birth year] Sex: female   Referred by: Dr. Linna Darner  Reason for referral:  Chief Complaint  Patient presents with  . Varicose Veins    selft referral vv's     HISTORY OF PRESENT ILLNESS: This is a delightful 58 year old female who comes in today for evaluation of her right leg.  She states that she developed phlebitis at the age of 53.  Since that time she has been wearing support hose.  She reports that she gets swelling in her right leg, particularly during the summertime.  She will also occasionally get a heavy/painful feeling.  She has not been able to tolerate a formal compression stockings secondary to itching.  However, she does wear very tight socks.  Past Medical History  Diagnosis Date  . Osteoporosis   . Hyperlipidemia     elevated due to very high HDL    Past Surgical History  Procedure Laterality Date  . Abdominal hysterectomy      USO for dysfunctional menses & endometriosis  . Tonsillectomy and adenoidectomy    . Colonoscopy  2012    [place]    History   Social History  . Marital Status: Widowed    Spouse Name: N/A    Number of Children: N/A  . Years of Education: N/A   Occupational History  . Not on file.   Social History Main Topics  . Smoking status: Never Smoker   . Smokeless tobacco: Never Used  . Alcohol Use: No  . Drug Use: No  . Sexual Activity: No   Other Topics Concern  . Not on file   Social History Narrative  . No narrative on file    Family History  Problem Relation Age of Onset  . Lung cancer Maternal Grandfather     smoker  . COPD Paternal Grandfather     smoker  . Diabetes Neg Hx   . Stroke Neg Hx   . Heart failure Paternal Grandmother     in 4s  . Varicose Veins Brother   . Peripheral vascular disease Brother     Allergies as of 04/27/2014 - Review Complete 04/27/2014  Allergen Reaction Noted  . Sulfonamide derivatives Hives     Current Outpatient  Prescriptions on File Prior to Visit  Medication Sig Dispense Refill  . CALCIUM-VITAMIN D PO Take by mouth daily. 500mg /100 units      . Cholecalciferol (VITAMIN D-3) 5000 UNITS TABS Take 1 tablet by mouth daily.      . clidinium-chlordiazePOXIDE (LIBRAX) 5-2.5 MG per capsule TAKE 1 CAPSULE THREE TIMES A DAY AS NEEDED  60 capsule  2  . GLUCOSAMINE-CHONDROITIN PO Take by mouth daily.      Marland Kitchen ibuprofen (ADVIL,MOTRIN) 600 MG tablet Take 600 mg by mouth every 6 (six) hours as needed.        . mometasone (ELOCON) 0.1 % ointment Apply topically 2 (two) times daily.  45 g  1  . Multiple Vitamins-Minerals (MULTIVITAMIN WITH MINERALS) tablet Take 1 tablet by mouth daily.         No current facility-administered medications on file prior to visit.     REVIEW OF SYSTEMS: Cardiovascular: No chest pain, chest pressure, palpitations, orthopnea, or dyspnea on exertion. No claudication or rest pain,  No history of DVT.  Please see history of present illness Pulmonary: No productive cough, asthma or wheezing. Neurologic: No weakness, paresthesias, aphasia, or amaurosis. No dizziness. Hematologic: No bleeding problems or clotting  disorders. Musculoskeletal: No joint pain or joint swelling. Gastrointestinal: No blood in stool or hematemesis Genitourinary: No dysuria or hematuria. Psychiatric:: No history of major depression. Integumentary: No rashes or ulcers. Constitutional: No fever or chills.  PHYSICAL EXAMINATION: General: The patient appears their stated age.  Vital signs are BP 130/74  Pulse 83  Ht 5\' 1"  (1.549 m)  Wt 107 lb 9.6 oz (48.807 kg)  BMI 20.34 kg/m2  SpO2 100% HEENT:  No gross abnormalities Pulmonary: Respirations are non-labored Abdomen: Soft and non-tender  Neurologic: No focal weakness or paresthesias are detected, Skin: There are no ulcer or rashes noted. Psychiatric: The patient has normal affect. Cardiovascular: Palpable pedal pulse on the right.  Spider veins present on the  anterior lateral lower leg and the anterior thigh.  Diagnostic Studies: Vascular lab studies were ordered and reviewed.  There is no evidence of deep vein obstruction on the right.  There is deep vein reflux.  She also has a superficial vein reflux.  Maximum diameter is 0.57 cm of the saphenofemoral    Assessment:  Venous insufficiency, right leg with spider veins Plan: Based on her ultrasound today, coupled with the fact that she has very minimal swelling, I have recommended that she just wear compression stockings as needed.  If she does develop more significant swelling and pain, I would consider repeating her ultrasound to see if she has had progression of her disease.  She has inquired about treating her spider veins.  I had Robyn Hudson come and evaluate her.  She feels that these can be done with one vial.  The patient is going to think about this and contact us if she would like to have sclerotherapy of her spider veins.     Eldridge Abrahams, M.D. [facility] Office: [phone] Pager:  [phone]

## 2014-04-28 ENCOUNTER — Encounter: Payer: Self-pay | Admitting: Internal Medicine

## 2014-04-28 LAB — VITAMIN D 1,25 DIHYDROXY
VITAMIN D3 1, 25 (OH): 38 pg/mL
Vitamin D 1, 25 (OH)2 Total: 38 pg/mL (ref 18–72)
Vitamin D2 1, 25 (OH)2: <8 pg/mL

## 2015-05-05 LAB — HM DEXA SCAN

## 2015-05-06 ENCOUNTER — Encounter: Payer: Self-pay | Admitting: Internal Medicine

## 2015-05-06 ENCOUNTER — Other Ambulatory Visit: Payer: Self-pay | Admitting: Internal Medicine

## 2015-05-06 ENCOUNTER — Ambulatory Visit (INDEPENDENT_AMBULATORY_CARE_PROVIDER_SITE_OTHER): Payer: BLUE CROSS/BLUE SHIELD | Admitting: Internal Medicine

## 2015-05-06 ENCOUNTER — Other Ambulatory Visit (INDEPENDENT_AMBULATORY_CARE_PROVIDER_SITE_OTHER): Payer: BLUE CROSS/BLUE SHIELD

## 2015-05-06 VITALS — BP 112/74 | HR 71 | Temp 98.0°F | Resp 16 | Wt 112.0 lb

## 2015-05-06 DIAGNOSIS — Z0189 Encounter for other specified special examinations: Secondary | ICD-10-CM | POA: Diagnosis not present

## 2015-05-06 DIAGNOSIS — Z0000 Encounter for general adult medical examination without abnormal findings: Secondary | ICD-10-CM

## 2015-05-06 LAB — CBC WITH DIFFERENTIAL/PLATELET
Basophils Absolute: 0 10*3/uL (ref 0.0–0.1)
Basophils Relative: 0.6 % (ref 0.0–3.0)
EOS PCT: 2.4 % (ref 0.0–5.0)
Eosinophils Absolute: 0.2 10*3/uL (ref 0.0–0.7)
HCT: 40.5 % (ref 36.0–46.0)
Hemoglobin: 13.8 g/dL (ref 12.0–15.0)
LYMPHS PCT: 33.7 % (ref 12.0–46.0)
Lymphs Abs: 2.4 10*3/uL (ref 0.7–4.0)
MCHC: 34 g/dL (ref 30.0–36.0)
MCV: 91.4 fl (ref 78.0–100.0)
MONO ABS: 0.6 10*3/uL (ref 0.1–1.0)
Monocytes Relative: 8.2 % (ref 3.0–12.0)
NEUTROS PCT: 55.1 % (ref 43.0–77.0)
Neutro Abs: 3.9 10*3/uL (ref 1.4–7.7)
PLATELETS: 320 10*3/uL (ref 150.0–400.0)
RBC: 4.44 Mil/uL (ref 3.87–5.11)
RDW: 13.9 % (ref 11.5–15.5)
WBC: 7.1 10*3/uL (ref 4.0–10.5)

## 2015-05-06 LAB — BASIC METABOLIC PANEL
BUN: 12 mg/dL (ref 6–23)
CHLORIDE: 104 meq/L (ref 96–112)
CO2: 29 mEq/L (ref 19–32)
Calcium: 9.8 mg/dL (ref 8.4–10.5)
Creatinine, Ser: 0.65 mg/dL (ref 0.40–1.20)
GFR: 98.72 mL/min (ref >60.00)
Glucose, Bld: 86 mg/dL (ref 70–99)
POTASSIUM: 4.5 meq/L (ref 3.5–5.1)
SODIUM: 141 meq/L (ref 135–145)

## 2015-05-06 LAB — HEPATIC FUNCTION PANEL
ALT: 20 U/L (ref 0–35)
AST: 23 U/L (ref 0–37)
Albumin: 4.5 g/dL (ref 3.5–5.2)
Alkaline Phosphatase: 83 U/L (ref 39–117)
BILIRUBIN TOTAL: 0.5 mg/dL (ref 0.2–1.2)
Bilirubin, Direct: 0.1 mg/dL (ref 0.0–0.3)
Total Protein: 7.2 g/dL (ref 6.0–8.3)

## 2015-05-06 LAB — TSH: TSH: 1.24 u[IU]/mL (ref 0.35–4.50)

## 2015-05-06 LAB — VITAMIN D 25 HYDROXY (VIT D DEFICIENCY, FRACTURES): VITD: 67.45 ng/mL (ref 30.00–100.00)

## 2015-05-06 MED ORDER — CILIDINIUM-CHLORDIAZEPOXIDE 2.5-5 MG PO CAPS: ORAL_CAPSULE | ORAL | Status: DC

## 2015-05-06 NOTE — Progress Notes (Signed)
Pre visit review using our clinic review tool, if applicable. No additional management support is needed unless otherwise documented below in the visit note. 

## 2015-05-06 NOTE — Patient Instructions (Signed)
  Your next office appointment will be determined based upon review of your pending labs  and  BMD  Those written interpretation of the lab results and instructions will be transmitted to you by My Chart  Critical results will be called.   Followup as needed for any active or acute issue. Please report any significant change in your symptoms.  Colonoscopy due 2022.

## 2015-05-06 NOTE — Progress Notes (Signed)
   Subjective:    Patient ID: Robyn Hudson, female    DOB: 1956/03/07, 59 y.o.   MRN: [id number]  HPI She is here for a physical;acute issues include bloating &  gas particularly when she eats fried foods. Librax is effective for this.  Generally she is on a heart healthy diet. She does ingest salt. She notes that this results in some swelling of her feet in Summer.  There is no change in family history.   She is otherwise asymptomatic.  Review of Systems  Chest pain, palpitations, tachycardia, exertional dyspnea, paroxysmal nocturnal dyspnea, or claudication are absent. No unexplained weight loss, abdominal pain, significant dyspepsia, dysphagia, melena, rectal bleeding, or persistently small caliber stools. Dysuria, pyuria, hematuria, frequency, nocturia or polyuria are denied. Change in hair, skin, nails denied. No bowel changes of constipation or diarrhea. No intolerance to heat or cold.      Objective:   Physical Exam  Pertinent or positive findings include: Aorta is palpable cervical no aneurysm present. She has slight crepitus of the knees. Venous spiders are noted over the lower extremities.   General appearance : Thin but adequately nourished; in no distress.  Eyes: No conjunctival inflammation or scleral icterus is present.  Oral exam:  Lips and gums are healthy appearing.There is no oropharyngeal erythema or exudate noted. Dental hygiene is good.  Heart:  Normal rate and regular rhythm. S1 and S2 normal without gallop, murmur, click, rub or other extra sounds    Lungs:Chest clear to auscultation; no wheezes, rhonchi,rales ,or rubs present.No increased work of breathing.   Abdomen: bowel sounds normal, soft and non-tender without masses, organomegaly or hernias noted.  No guarding or rebound.   Vascular : all pulses equal ; no bruits present.  Skin:Warm & dry.  Intact without suspicious lesions or rashes ; no tenting or jaundice   Lymphatic: No lymphadenopathy  is noted about the head, neck, axilla   Neuro: Strength, tone & DTRs normal.         Assessment & Plan:  #1 comprehensive physical exam; no acute findings  Plan: see Orders  & Recommendations

## 2015-05-08 LAB — NMR LIPOPROFILE WITH LIPIDS
CHOLESTEROL, TOTAL: 245 mg/dL — AB (ref 100–199)
HDL Particle Number: 41.9 umol/L (ref >=30.5)
HDL SIZE: 9.8 nm (ref >=9.2)
HDL-C: 85 mg/dL (ref >39)
LDL CALC: 146 mg/dL — AB (ref 0–99)
LDL PARTICLE NUMBER: 1401 nmol/L — AB (ref <1000)
LDL Size: 21.8 nm (ref >=20.8)
LP-IR Score: <25 (ref <=45)
Large HDL-P: 13.8 umol/L (ref >=4.8)
Large VLDL-P: 1.6 nmol/L (ref <=2.7)
Small LDL Particle Number: 137 nmol/L (ref <=527)
TRIGLYCERIDES: 69 mg/dL (ref 0–149)
VLDL SIZE: 42.6 nm (ref <=46.6)

## 2015-05-13 ENCOUNTER — Telehealth: Payer: Self-pay | Admitting: Internal Medicine

## 2015-05-13 ENCOUNTER — Other Ambulatory Visit: Payer: Self-pay | Admitting: Emergency Medicine

## 2015-05-13 MED ORDER — CILIDINIUM-CHLORDIAZEPOXIDE 2.5-5 MG PO CAPS: ORAL_CAPSULE | ORAL | Status: DC

## 2015-05-13 NOTE — Telephone Encounter (Signed)
Patient calling for the results of her bone density test.

## 2015-05-13 NOTE — Telephone Encounter (Signed)
[facility] also states that [facility] not [facility]rax and is requesting script to be resent.

## 2015-05-13 NOTE — Telephone Encounter (Signed)
Refill request was sent to mail order pharm. Bone Density results have been mailed to pt and pt will call back to schedule an appt to discuss results.

## 2015-05-19 ENCOUNTER — Encounter: Payer: Self-pay | Admitting: Internal Medicine

## 2015-05-20 ENCOUNTER — Encounter: Payer: Self-pay | Admitting: Internal Medicine

## 2015-06-02 ENCOUNTER — Encounter: Payer: Self-pay | Admitting: Internal Medicine

## 2015-08-26 ENCOUNTER — Other Ambulatory Visit (INDEPENDENT_AMBULATORY_CARE_PROVIDER_SITE_OTHER): Payer: BLUE CROSS/BLUE SHIELD

## 2015-08-26 ENCOUNTER — Ambulatory Visit (INDEPENDENT_AMBULATORY_CARE_PROVIDER_SITE_OTHER): Payer: BLUE CROSS/BLUE SHIELD | Admitting: Internal Medicine

## 2015-08-26 ENCOUNTER — Encounter: Payer: Self-pay | Admitting: Internal Medicine

## 2015-08-26 VITALS — BP 110/70 | HR 96 | Temp 98.1°F | Ht 61.0 in | Wt 112.0 lb

## 2015-08-26 DIAGNOSIS — R197 Diarrhea, unspecified: Secondary | ICD-10-CM

## 2015-08-26 DIAGNOSIS — R1310 Dysphagia, unspecified: Secondary | ICD-10-CM | POA: Diagnosis not present

## 2015-08-26 DIAGNOSIS — R1032 Left lower quadrant pain: Secondary | ICD-10-CM

## 2015-08-26 DIAGNOSIS — R140 Abdominal distension (gaseous): Secondary | ICD-10-CM | POA: Diagnosis not present

## 2015-08-26 LAB — CBC WITH DIFFERENTIAL/PLATELET
BASOS ABS: 0.1 10*3/uL (ref 0.0–0.1)
Basophils Relative: 0.7 % (ref 0.0–3.0)
EOS ABS: 0.4 10*3/uL (ref 0.0–0.7)
Eosinophils Relative: 4.2 % (ref 0.0–5.0)
HCT: 41.8 % (ref 36.0–46.0)
HEMOGLOBIN: 13.9 g/dL (ref 12.0–15.0)
LYMPHS ABS: 2.6 10*3/uL (ref 0.7–4.0)
Lymphocytes Relative: 26.5 % (ref 12.0–46.0)
MCHC: 33.3 g/dL (ref 30.0–36.0)
MCV: 91.4 fl (ref 78.0–100.0)
MONO ABS: 0.7 10*3/uL (ref 0.1–1.0)
Monocytes Relative: 7.4 % (ref 3.0–12.0)
Neutro Abs: 6 10*3/uL (ref 1.4–7.7)
Neutrophils Relative %: 61.2 % (ref 43.0–77.0)
Platelets: 340 10*3/uL (ref 150.0–400.0)
RBC: 4.57 Mil/uL (ref 3.87–5.11)
RDW: 14.1 % (ref 11.5–15.5)
WBC: 9.8 10*3/uL (ref 4.0–10.5)

## 2015-08-26 LAB — URINALYSIS
BILIRUBIN URINE: NEGATIVE
KETONES UR: 40 — AB
LEUKOCYTES UA: NEGATIVE
Nitrite: NEGATIVE
PH: 5.5 (ref 5.0–8.0)
TOTAL PROTEIN, URINE-UPE24: NEGATIVE
Urine Glucose: NEGATIVE
Urobilinogen, UA: 0.2 (ref 0.0–1.0)

## 2015-08-26 MED ORDER — HYOSCYAMINE SULFATE 0.125 MG SL SUBL: 0.1250 mg | SUBLINGUAL_TABLET | SUBLINGUAL | Status: DC | PRN

## 2015-08-26 MED ORDER — OMEPRAZOLE 20 MG PO CPDR: 20.0000 mg | DELAYED_RELEASE_CAPSULE | Freq: Two times a day (BID) | ORAL | Status: DC

## 2015-08-26 NOTE — Progress Notes (Signed)
   Subjective:    Patient ID: Robyn Hudson, female    DOB: 1956/06/11, 59 y.o.   MRN: [id number]  HPI Over the last 4-5 weeks she has had intermittent bloating and lower abdominal discomfort shortly after eating. She also is describing dysphagia with every meal. She has taken Librax but this is not of benefit. She also quit drinking milk without any improvement. Changing to soft foods did not impact the symptoms. She also quit taking vitamins but the symptoms persist. Yesterday she took 2 bites of food" felt full".  She describes nausea but no vomiting.  Approximately 3 times a week over the last  3 weeks she's had explosive diarrhea. Intermittently she will have  normal bowel movements.  The abdominal symptoms are not associated with flushing.  Today she has remained NPO and has no discomfort.  PMH of diverticulosis on colonoscopy.  Her gynecologic care was completed this past Summer and was normal.  She has remarried after being widowed. She states she is very happily married.   Review of Systems  There is no significant cough, sputum production,hemoptysis, wheezing,or  paroxysmal nocturnal dyspnea. Unexplained weight loss,  significant dyspepsia,  melena, rectal bleeding, or persistently small caliber stools are not present. Dysuria, pyuria, hematuria, frequency, nocturia or polyuria are denied.    Objective:   Physical Exam  Pertinent or positive findings include: Tenderness to palpation is present in the left lower quadrant. General appearance :adequately nourished; in no distress.  Eyes: No conjunctival inflammation or scleral icterus is present.  Oral exam:  Lips and gums are healthy appearing.There is no oropharyngeal erythema or exudate noted. Dental hygiene is good.  Heart:  Normal rate and regular rhythm. S1 and S2 normal without gallop, murmur, click, rub or other extra sounds    Lungs:Chest clear to auscultation; no wheezes, rhonchi,rales ,or rubs present.No  increased work of breathing.   Abdomen: bowel sounds normal, soft  without masses, organomegaly or hernias noted.  No guarding or rebound. No flank tenderness to percussion.  Vascular : all pulses equal ; no bruits present.  Skin:Warm & dry.  Intact without suspicious lesions or rashes ; no tenting or jaundice   Lymphatic: No lymphadenopathy is noted about the head, neck, axilla.   Neuro: Strength, tone & DTRs normal.     Assessment & Plan:  #1 low abdominal pain; doubt diverticulosis. 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  #2 dysphagia  #3 explosive diarrhea  See orders and recommendations

## 2015-08-26 NOTE — Progress Notes (Signed)
Pre visit review using our clinic review tool, if applicable. No additional management support is needed unless otherwise documented below in the visit note. 

## 2015-08-26 NOTE — Patient Instructions (Signed)
Reflux of gastric acid may be asymptomatic as this may occur mainly during sleep.The triggers for reflux  include stress; the "aspirin family" ; peppermint; and caffeine (coffee, tea, cola, and chocolate). The aspirin family would include aspirin and the nonsteroidal agents such as ibuprofen &  Naproxen. Tylenol would not cause reflux. If having symptoms ; food & drink should be avoided for @ least 2 hours before going to bed.   Take the protein pump inhibitor Omeprazole  30 minutes before breakfast and 30 minutes before the evening meal for 8 weeks then go back to once a day  30 minutes before breakfast.

## 2015-08-27 ENCOUNTER — Encounter: Payer: Self-pay | Admitting: Internal Medicine

## 2015-08-27 ENCOUNTER — Other Ambulatory Visit: Payer: Self-pay | Admitting: Internal Medicine

## 2015-08-27 DIAGNOSIS — R1032 Left lower quadrant pain: Secondary | ICD-10-CM

## 2015-08-27 DIAGNOSIS — R1310 Dysphagia, unspecified: Secondary | ICD-10-CM

## 2015-08-27 LAB — BASIC METABOLIC PANEL
BUN: 13 mg/dL (ref 6–23)
CALCIUM: 10.1 mg/dL (ref 8.4–10.5)
CO2: 29 mEq/L (ref 19–32)
CREATININE: 0.74 mg/dL (ref 0.40–1.20)
Chloride: 106 mEq/L (ref 96–112)
GFR: 84.91 mL/min (ref >60.00)
GLUCOSE: 90 mg/dL (ref 70–99)
POTASSIUM: 4.3 meq/L (ref 3.5–5.1)
Sodium: 143 mEq/L (ref 135–145)

## 2015-08-27 LAB — HEPATIC FUNCTION PANEL
ALBUMIN: 4.6 g/dL (ref 3.5–5.2)
ALT: 21 U/L (ref 0–35)
AST: 24 U/L (ref 0–37)
Alkaline Phosphatase: 83 U/L (ref 39–117)
BILIRUBIN DIRECT: 0.1 mg/dL (ref 0.0–0.3)
TOTAL PROTEIN: 7.1 g/dL (ref 6.0–8.3)
Total Bilirubin: 0.7 mg/dL (ref 0.2–1.2)

## 2015-08-27 LAB — LIPASE: LIPASE: 26 U/L (ref 11.0–59.0)

## 2015-08-30 ENCOUNTER — Telehealth: Payer: Self-pay

## 2015-08-30 NOTE — Telephone Encounter (Signed)
Faxed to lab.

## 2015-08-30 NOTE — Telephone Encounter (Signed)
-----   Message from Hendricks Limes, MD sent at 08/27/2015  1:19 PM EDT ----- Please order urine culture Dx: microscopic hematuria; LLQ abd pain

## 2015-08-31 ENCOUNTER — Telehealth: Payer: Self-pay | Admitting: Emergency Medicine

## 2015-08-31 DIAGNOSIS — R1032 Left lower quadrant pain: Secondary | ICD-10-CM

## 2015-08-31 NOTE — Telephone Encounter (Signed)
Lab called to inform that they were unable to do the urine culture add-on for pt due to it being more than 24 hrs have urine sample was taken. Would you like pt to come back in for sample? Please advise.

## 2015-09-01 NOTE — Telephone Encounter (Signed)
Please inform her  To be cautious ,I recommend repeat UA and C&S

## 2015-09-01 NOTE — Telephone Encounter (Signed)
Pt has been informed.

## 2015-09-08 ENCOUNTER — Telehealth: Payer: Self-pay | Admitting: Internal Medicine

## 2015-09-09 NOTE — Telephone Encounter (Signed)
Pharmacist left msg on triage stating the medication Clidinium-chlordiaz 5-2.5 med has been d/c by manufacturer the alternate is non ab-rated generated...Robyn Hudson

## 2015-09-10 ENCOUNTER — Encounter: Payer: Self-pay | Admitting: Internal Medicine

## 2015-09-10 ENCOUNTER — Other Ambulatory Visit: Payer: Self-pay | Admitting: Emergency Medicine

## 2015-09-10 DIAGNOSIS — R140 Abdominal distension (gaseous): Secondary | ICD-10-CM

## 2015-09-10 MED ORDER — HYOSCYAMINE SULFATE 0.125 MG SL SUBL: 0.1250 mg | SUBLINGUAL_TABLET | SUBLINGUAL | Status: DC | PRN

## 2015-09-10 NOTE — Telephone Encounter (Signed)
Change to Levsin SL q 6-8 hrs prn #30

## 2015-09-13 NOTE — Telephone Encounter (Signed)
[facility]...Johny Chess

## 2015-09-22 ENCOUNTER — Encounter: Payer: Self-pay | Admitting: Gastroenterology

## 2015-09-24 ENCOUNTER — Encounter: Payer: Self-pay | Admitting: Internal Medicine

## 2015-09-24 ENCOUNTER — Other Ambulatory Visit: Payer: Self-pay

## 2015-09-24 MED ORDER — CILIDINIUM-CHLORDIAZEPOXIDE 2.5-5 MG PO CAPS: 1.0000 | ORAL_CAPSULE | Freq: Three times a day (TID) | ORAL | Status: DC | PRN

## 2015-09-24 MED ORDER — CILIDINIUM-CHLORDIAZEPOXIDE 2.5-5 MG PO CAPS: 1.0000 | ORAL_CAPSULE | Freq: Three times a day (TID) | ORAL | Status: DC

## 2015-09-24 NOTE — Telephone Encounter (Signed)
[facility] day [facility] per pt request. Pt did not want it to go to local.

## 2015-09-28 ENCOUNTER — Ambulatory Visit (INDEPENDENT_AMBULATORY_CARE_PROVIDER_SITE_OTHER): Payer: BLUE CROSS/BLUE SHIELD | Admitting: Gastroenterology

## 2015-09-28 ENCOUNTER — Encounter: Payer: Self-pay | Admitting: Gastroenterology

## 2015-09-28 VITALS — BP 100/60 | HR 80 | Ht 61.0 in | Wt 113.4 lb

## 2015-09-28 DIAGNOSIS — R142 Eructation: Secondary | ICD-10-CM

## 2015-09-28 DIAGNOSIS — R110 Nausea: Secondary | ICD-10-CM | POA: Insufficient documentation

## 2015-09-28 DIAGNOSIS — K219 Gastro-esophageal reflux disease without esophagitis: Secondary | ICD-10-CM | POA: Diagnosis not present

## 2015-09-28 NOTE — Patient Instructions (Signed)

## 2015-09-28 NOTE — Progress Notes (Signed)
09/28/2015 Robyn Hudson [id number] [birth year]   HISTORY OF PRESENT ILLNESS:  This is a 59 year old female who is known to Dr. Henrene Pastor just for colonoscopy in March 2012 at which time it showed only moderate diverticulosis.  She presents to our office today with some upper GI complaints. She says that for the last couple of months she has been experiencing some intermittent lower abdominal discomfort that comes and goes. Mostly, however, she complains of a full sensation when eating. She says it feels like the food is just backing up and sitting in her esophagus. She denies any dysphagia. This has caused her to have a decrease in appetite since she feels better when she does not eat.  Has a lot belching as well.  She was placed on omeprazole twice daily by her PCP, Dr. Linna Darner. She took 3 doses of that, however, after the third dose she had an episode of vomiting so she thought it was from the medication and discontinued it. Coincidentally, however, after taking these 3 doses she did start to feel better for several days. Now she is taking some over-the-counter famotidine 20 mg as needed and some other type of supplement with ginger root, fennel seed, and chamomile, which she said worked better than the [employer]. She does not really like taking medication on a regular basis. She has never had any similar symptoms in the past.  Past Medical History  Diagnosis Date  . Osteoporosis   . Hyperlipidemia     elevated due to very high HDL  . Vitamin D deficiency    Past Surgical History  Procedure Laterality Date  . Abdominal hysterectomy      USO for dysfunctional menses & endometriosis  . Tonsillectomy and adenoidectomy    . Colonoscopy  2012    [place]    reports that she has never smoked. She has never used smokeless tobacco. She reports that she does not drink alcohol or use illicit drugs. family history includes COPD in her paternal grandfather; Heart failure in her paternal grandmother;  Lung cancer in her maternal grandfather; Peripheral vascular disease in her brother; Varicose Veins in her brother. There is no history of Diabetes or Stroke. Allergies  Allergen Reactions  . Sulfonamide Derivatives Hives    Urticaria    Outpatient Encounter Prescriptions as of 09/28/2015  Medication Sig  . CALCIUM ASPARTATE PO Take by mouth daily.  Marland Kitchen CALCIUM-VITAMIN D PO Take by mouth daily. 500mg /100 units  . Cholecalciferol (VITAMIN D-3) 5000 UNITS TABS Take 1 tablet by mouth daily.  . clidinium-chlordiazePOXIDE (LIBRAX) 5-2.5 MG capsule Take 1 capsule by mouth 3 (three) times daily as needed.  . Ginkgo Biloba 40 MG TABS Take 120 mg by mouth daily.  Marland Kitchen GLUCOSAMINE-CHONDROITIN PO Take by mouth daily.  . hyoscyamine (LEVSIN/SL) 0.125 MG SL tablet Place 1 tablet (0.125 mg total) under the tongue every 4 (four) hours as needed.  Marland Kitchen ibuprofen (ADVIL,MOTRIN) 600 MG tablet Take 600 mg by mouth every 6 (six) hours as needed.    Marland Kitchen OVER THE COUNTER MEDICATION Take 2 mg by mouth daily. Marvlix supplements  . OVER THE COUNTER MEDICATION Calcid antiacid pills over the counter  . Probiotic Product (PROBIOTIC ADVANCED PO) Take by mouth. Florify probiotic once a day  . [DISCONTINUED] Multiple Vitamins-Minerals (MULTIVITAMIN WITH MINERALS) tablet Take 1 tablet by mouth daily.    . [DISCONTINUED] omeprazole (PRILOSEC) 20 MG capsule Take 1 capsule (20 mg total) by mouth 2 (two) times daily before  a meal.   No facility-administered encounter medications on file as of 09/28/2015.     REVIEW OF SYSTEMS  : All other systems reviewed and negative except where noted in the History of Present Illness.   PHYSICAL EXAM: BP 100/60 mmHg  Pulse 80  Ht 5\' 1"  (1.549 m)  Wt 113 lb 6 oz (51.427 kg)  BMI 21.43 kg/m2 General: Well developed white female in no acute distress Head: Normocephalic and atraumatic Eyes:  Sclerae anicteric, conjunctiva pink. Ears: Normal auditory acuity. Lungs: Clear throughout to  auscultation Heart: Regular rate and rhythm Abdomen: Soft, non-distended.  Normal bowel sounds.  Non-tender. Musculoskeletal: Symmetrical with no gross deformities  Skin: No lesions on visible extremities Extremities: No edema  Neurological: Alert oriented x 4, grossly non-focal Psychological:  Alert and cooperative. Normal mood and affect  ASSESSMENT AND PLAN: -GERD, fullness sensation, nausea, belching:  Likely all reflux related, but she has never undergone EGD in the past and these symptoms are new for her. She is not a fan of taking medications regularly unless absolutely necessary.  Will schedule EGD with Dr. Henrene Pastor to evaluate and determine GERD vs esophagitis, etc.  The risks, benefits, and alternatives to EGD were discussed with the patient and she consents to proceed.  CC:  Hendricks Limes, MD

## 2015-09-28 NOTE — Progress Notes (Signed)
Agree with initial assessment and plans as outlined 

## 2015-10-05 ENCOUNTER — Encounter: Payer: Self-pay | Admitting: Internal Medicine

## 2015-10-05 ENCOUNTER — Ambulatory Visit (AMBULATORY_SURGERY_CENTER): Payer: BLUE CROSS/BLUE SHIELD | Admitting: Internal Medicine

## 2015-10-05 VITALS — BP 123/74 | HR 76 | Temp 98.7°F | Resp 17 | Ht 61.0 in | Wt 113.0 lb

## 2015-10-05 DIAGNOSIS — K2100 Gastro-esophageal reflux disease with esophagitis, without bleeding: Secondary | ICD-10-CM

## 2015-10-05 DIAGNOSIS — K222 Esophageal obstruction: Secondary | ICD-10-CM

## 2015-10-05 DIAGNOSIS — K219 Gastro-esophageal reflux disease without esophagitis: Secondary | ICD-10-CM

## 2015-10-05 MED ORDER — PANTOPRAZOLE SODIUM 40 MG PO TBEC: 40.0000 mg | DELAYED_RELEASE_TABLET | Freq: Every day | ORAL | Status: DC

## 2015-10-05 MED ORDER — SODIUM CHLORIDE 0.9 % IV SOLN: 500.0000 mL | INTRAVENOUS | Status: DC

## 2015-10-05 NOTE — Op Note (Signed)
[facility]  [address] [place], [address]   ENDOSCOPY PROCEDURE REPORT  PATIENT: Robyn, Hudson  MR#: [id number] BIRTHDATE: 10/18/1956 , 63  yrs. old GENDER: female ENDOSCOPIST: Eustace Quail, MD REFERRED BY:  .  Self / Office PROCEDURE DATE:  10/05/2015 PROCEDURE:  EGD, diagnostic and Maloney dilation of esophagus   -50 French ASA CLASS:     Class II INDICATIONS:  dysphagia. Intermittent solid food and pill dysphagia.  MEDICATIONS: Monitored anesthesia care and Propofol 100 mg IV TOPICAL ANESTHETIC: none  DESCRIPTION OF PROCEDURE: After the risks benefits and alternatives of the procedure were thoroughly explained, informed consent was obtained.  The LB [id number] endoscope was introduced through the mouth and advanced to the second portion of the duodenum , Without limitations.  The instrument was slowly withdrawn as the mucosa was fully examined.  Esophagus revealed esophagitis as manifested by edema and small erosions at the gastroesophageal junction.  As well, large caliber stricture formation.  The stomach was normal.  The duodenum was normal.  Retroflexed views revealed a hiatal hernia.     The scope was then withdrawn from the patient and the procedure completed.THERAPY: 66 French Maloney dilator was passed into the esophagus without resistance or heme. Tolerated well  COMPLICATIONS: There were no immediate complications.  ENDOSCOPIC IMPRESSION: 1. GERD complicated by erosive esophagitis and stricture formation   RECOMMENDATIONS: 1.  Prescribe pantoprazole 40 mg daily; #30; 11 refills 2.  Clear liquids until 12 new, then soft foods rest of day.  Resume prior diet tomorrow. 3.  Call office next 2-3 days to schedule an office appointment with Dr. Henrene Pastor in about 2 months  REPEAT EXAM:  eSigned:  Eustace Quail, MD 10/05/2015 10:56 AM    CC:

## 2015-10-05 NOTE — Progress Notes (Signed)
  [facility] Post-op Note  Patient: Robyn Hudson  Procedure(s) Performed: endoscopy with dilatation  Patient Location: [facility] - Recovery Area  Anesthesia Type: Deep Sedation/Propofol  Level of Consciousness: awake, oriented and patient cooperative  Airway and Oxygen Therapy: Patient Spontanous Breathing  Post-op Pain: none  Post-op Assessment:  Post-op Vital signs reviewed, Patient's Cardiovascular Status Stable, Respiratory Function Stable, Patent Airway, No signs of Nausea or vomiting and Pain level controlled  Post-op Vital Signs: Reviewed and stable  Complications: No apparent anesthesia complications  Massiah Minjares E 10:57 AM

## 2015-10-05 NOTE — Progress Notes (Signed)
Called to room to assist during endoscopic procedure.  Patient ID and intended procedure confirmed with present staff. Received instructions for my participation in the procedure from the performing physician.  

## 2015-10-05 NOTE — Patient Instructions (Signed)
YOU HAD AN ENDOSCOPIC PROCEDURE TODAY AT [facility]:   Refer to the procedure report that was given to you for any specific questions about what was found during the examination.  If the procedure report does not answer your questions, please call your gastroenterologist to clarify.  If you requested that your care partner not be given the details of your procedure findings, then the procedure report has been included in a sealed envelope for you to review at your convenience later.  YOU SHOULD EXPECT: Some feelings of bloating in the abdomen. Passage of more gas than usual.  Walking can help get rid of the air that was put into your GI tract during the procedure and reduce the bloating. If you had a lower endoscopy (such as a colonoscopy or flexible sigmoidoscopy) you may notice spotting of blood in your stool or on the toilet paper. If you underwent a bowel prep for your procedure, you may not have a normal bowel movement for a few days.  Please Note:  You might notice some irritation and congestion in your nose or some drainage.  This is from the oxygen used during your procedure.  There is no need for concern and it should clear up in a day or so.  SYMPTOMS TO REPORT IMMEDIATELY:     Following upper endoscopy (EGD)  Vomiting of blood or coffee ground material  New chest pain or pain under the shoulder blades  Painful or persistently difficult swallowing  New shortness of breath  Fever of 100F or higher  Black, tarry-looking stools  For urgent or emergent issues, a gastroenterologist can be reached at any hour by calling [phone].   DIET: Your first meal following the procedure should be a small meal and then it is ok to progress to your normal diet. Heavy or fried foods are harder to digest and may make you feel nauseous or bloated.  Likewise, meals heavy in dairy and vegetables can increase bloating.  Drink plenty of fluids but you should avoid alcoholic beverages  for 24 hours.  ACTIVITY:  You should plan to take it easy for the rest of today and you should NOT DRIVE or use heavy machinery until tomorrow (because of the sedation medicines used during the test).    FOLLOW UP: Our staff will call the number listed on your records the next business day following your procedure to check on you and address any questions or concerns that you may have regarding the information given to you following your procedure. If we do not reach you, we will leave a message.  However, if you are feeling well and you are not experiencing any problems, there is no need to return our call.  We will assume that you have returned to your regular daily activities without incident.  If any biopsies were taken you will be contacted by phone or by letter within the next 1-3 weeks.  Please call us at [phone] if you have not heard about the biopsies in 3 weeks.    SIGNATURES/CONFIDENTIALITY: You and/or your care partner have signed paperwork which will be entered into your electronic medical record.  These signatures attest to the fact that that the information above on your After Visit Summary has been reviewed and is understood.  Full responsibility of the confidentiality of this discharge information lies with you and/or your care-partner   [facility]  Clear liquids only until 12 noon today,then soft food the  rest of today, may resume regular diet tomorrow if tolerated  Call Dr.Perry's office to schedule an appointment for 2 months.

## 2015-10-06 ENCOUNTER — Telehealth: Payer: Self-pay | Admitting: *Deleted

## 2015-10-06 ENCOUNTER — Telehealth: Payer: Self-pay | Admitting: Internal Medicine

## 2015-10-06 DIAGNOSIS — K2100 Gastro-esophageal reflux disease with esophagitis, without bleeding: Secondary | ICD-10-CM

## 2015-10-06 DIAGNOSIS — K219 Gastro-esophageal reflux disease without esophagitis: Secondary | ICD-10-CM

## 2015-10-06 DIAGNOSIS — K222 Esophageal obstruction: Secondary | ICD-10-CM

## 2015-10-06 MED ORDER — PANTOPRAZOLE SODIUM 40 MG PO TBEC: 40.0000 mg | DELAYED_RELEASE_TABLET | Freq: Every day | ORAL | Status: DC

## 2015-10-06 NOTE — Telephone Encounter (Signed)
  Follow up Call-  Call back number 10/05/2015  Post procedure Call Back phone  # [phone]  Permission to leave phone message Yes     Patient questions:  Do you have a fever, pain , or abdominal swelling? No. Pain Score  0 *  Have you tolerated food without any problems? Yes.    Have you been able to return to your normal activities? Yes.    Do you have any questions about your discharge instructions: Diet   No. Medications  No. Follow up visit  No.  Do you have questions or concerns about your Care? No.  Actions: * If pain score is 4 or above: No action needed, pain <4.

## 2015-10-06 NOTE — Telephone Encounter (Signed)
Protonix sent to [facility] 

## 2015-11-01 ENCOUNTER — Ambulatory Visit: Payer: BLUE CROSS/BLUE SHIELD | Admitting: Internal Medicine

## 2015-12-03 ENCOUNTER — Encounter: Payer: Self-pay | Admitting: *Deleted

## 2015-12-06 ENCOUNTER — Ambulatory Visit (INDEPENDENT_AMBULATORY_CARE_PROVIDER_SITE_OTHER): Payer: BLUE CROSS/BLUE SHIELD | Admitting: Internal Medicine

## 2015-12-06 ENCOUNTER — Encounter: Payer: Self-pay | Admitting: Internal Medicine

## 2015-12-06 VITALS — BP 112/62 | HR 72 | Ht 61.0 in | Wt 111.4 lb

## 2015-12-06 DIAGNOSIS — K2100 Gastro-esophageal reflux disease with esophagitis, without bleeding: Secondary | ICD-10-CM

## 2015-12-06 DIAGNOSIS — K222 Esophageal obstruction: Secondary | ICD-10-CM | POA: Diagnosis not present

## 2015-12-06 NOTE — Progress Notes (Signed)
HISTORY OF PRESENT ILLNESS:  Robyn Hudson is a 60 y.o. female who presents today for follow-up. She was evaluated 09/28/2015 by the physician assistant with abdominal discomfort, dyspeptic symptoms and dysphagia. She underwent upper endoscopy 10/05/2015 and was found to have erosive esophagitis with stricture formation. She was dilated with 52 [place] Maloney dilator. She was placed on pantoprazole 40 mg daily and this follow-up appointment arranged. Overall patient has had marked improvement in her symptoms. Classic reflux symptoms have resolved. Issues with dysphagia. She still describes an occasional globus or fullness type sensation. No significant abdominal discomfort some mild constipation. Colonoscopy in 2012 revealed diverticulosis only she has multiple questions regarding her findings on endoscopy and reflux disease. She is tolerating her medication without issues  REVIEW OF SYSTEMS:  All non-GI ROS negative upon review  Past Medical History  Diagnosis Date  . Osteoporosis   . Hyperlipidemia     elevated due to very high HDL  . Vitamin D deficiency   . GERD (gastroesophageal reflux disease)   . Esophageal stricture   . Diverticulosis     Past Surgical History  Procedure Laterality Date  . Abdominal hysterectomy      USO for dysfunctional menses & endometriosis  . Tonsillectomy and adenoidectomy    . Colonoscopy  2012    [place]    Social History Robyn Hudson  reports that she has never smoked. She has never used smokeless tobacco. She reports that she does not drink alcohol or use illicit drugs.  family history includes COPD in her paternal grandfather; Heart failure in her paternal grandmother; Lung cancer in her maternal grandfather; Peripheral vascular disease in her brother; Varicose Veins in her brother. There is no history of Diabetes or Stroke.  Allergies  Allergen Reactions  . Sulfonamide Derivatives Hives    Urticaria       PHYSICAL  EXAMINATION: Vital signs: BP 112/62 mmHg  Pulse 72  Ht 5\' 1"  (1.549 m)  Wt 111 lb 6.4 oz (50.531 kg)  BMI 21.06 kg/m2 General: Well-developed, well-nourished, no acute distress HEENT: Sclerae are anicteric, conjunctiva pink. Oral mucosa intact Lungs: Clear Heart: Regular Abdomen: soft, nontender, nondistended, no obvious ascites, no peritoneal signs, normal bowel sounds. No organomegaly. Extremities: No clubbing cyanosis or edema Psychiatric: alert and oriented x3. Cooperative   ASSESSMENT:  #1. GERD complicated by erosive esophagitis and peptic stricture. Markedly improved on PPI status post dilation #2. Diverticulosis on colonoscopy 2012   PLAN:  #1. Educational discussion on the pathophysiology of GERD and treatment strategies #2. Continue pantoprazole 40 mg daily #3. Routine office follow-up in 1 year #4. Repeat screening colonoscopy 2022  15 minutes was spent face-to-face with the patient. Greater than 50% of the time use for counseling regarding GERD and answering her questions

## 2015-12-06 NOTE — Patient Instructions (Signed)
Please follow up in one year 

## 2015-12-22 ENCOUNTER — Telehealth: Payer: Self-pay | Admitting: Internal Medicine

## 2015-12-22 DIAGNOSIS — K2100 Gastro-esophageal reflux disease with esophagitis, without bleeding: Secondary | ICD-10-CM

## 2015-12-22 DIAGNOSIS — K222 Esophageal obstruction: Secondary | ICD-10-CM

## 2015-12-22 DIAGNOSIS — K219 Gastro-esophageal reflux disease without esophagitis: Secondary | ICD-10-CM

## 2015-12-24 MED ORDER — PANTOPRAZOLE SODIUM 40 MG PO TBEC: 40.0000 mg | DELAYED_RELEASE_TABLET | Freq: Every day | ORAL | Status: DC

## 2015-12-24 NOTE — Telephone Encounter (Signed)
Sent Protonix to [facility] per patient's request.  Patient wanted to know if Dr. Henrene Pastor would prescribe Librax for her - previously prescribed by Dr. Linna Darner who has retired.  I told her I would message him and get back to her.  Patient agreed.

## 2016-01-14 ENCOUNTER — Other Ambulatory Visit: Payer: Self-pay | Admitting: Internal Medicine

## 2016-01-20 NOTE — Telephone Encounter (Signed)
I have not seen her for any particular reason that would necessitate prescribing this. Though it may be reasonable, we should see her first and decide as this can have some side effects. Set up appointment with advanced practitioner or myself. Thanks            I spoke with the pt and she states she is not having any problems now and just wanted a refill for "back up"  She will call back if she decides to come in and see Dr Henrene Pastor to discuss.

## 2016-01-20 NOTE — Telephone Encounter (Signed)
Can patient have Librax refills?

## 2016-01-21 NOTE — Telephone Encounter (Signed)
Noted  

## 2016-06-06 ENCOUNTER — Encounter: Payer: BLUE CROSS/BLUE SHIELD | Admitting: Vascular Surgery

## 2016-06-06 ENCOUNTER — Encounter (HOSPITAL_COMMUNITY): Payer: BLUE CROSS/BLUE SHIELD

## 2016-06-09 DIAGNOSIS — Z1231 Encounter for screening mammogram for malignant neoplasm of breast: Secondary | ICD-10-CM | POA: Diagnosis not present

## 2016-06-09 DIAGNOSIS — Z6821 Body mass index (BMI) 21.0-21.9, adult: Secondary | ICD-10-CM | POA: Diagnosis not present

## 2016-06-09 DIAGNOSIS — Z0000 Encounter for general adult medical examination without abnormal findings: Secondary | ICD-10-CM | POA: Diagnosis not present

## 2016-06-09 DIAGNOSIS — K589 Irritable bowel syndrome without diarrhea: Secondary | ICD-10-CM | POA: Diagnosis not present

## 2016-06-09 DIAGNOSIS — Z1389 Encounter for screening for other disorder: Secondary | ICD-10-CM | POA: Diagnosis not present

## 2016-06-09 DIAGNOSIS — Z01419 Encounter for gynecological examination (general) (routine) without abnormal findings: Secondary | ICD-10-CM | POA: Diagnosis not present

## 2016-07-07 DIAGNOSIS — R300 Dysuria: Secondary | ICD-10-CM | POA: Diagnosis not present

## 2016-07-11 DIAGNOSIS — C44712 Basal cell carcinoma of skin of right lower limb, including hip: Secondary | ICD-10-CM | POA: Diagnosis not present

## 2016-07-11 DIAGNOSIS — D485 Neoplasm of uncertain behavior of skin: Secondary | ICD-10-CM | POA: Diagnosis not present

## 2016-07-11 DIAGNOSIS — L821 Other seborrheic keratosis: Secondary | ICD-10-CM | POA: Diagnosis not present

## 2016-07-11 DIAGNOSIS — L570 Actinic keratosis: Secondary | ICD-10-CM | POA: Diagnosis not present

## 2016-07-25 DIAGNOSIS — C44712 Basal cell carcinoma of skin of right lower limb, including hip: Secondary | ICD-10-CM | POA: Diagnosis not present

## 2016-07-26 ENCOUNTER — Ambulatory Visit (INDEPENDENT_AMBULATORY_CARE_PROVIDER_SITE_OTHER): Payer: BLUE CROSS/BLUE SHIELD | Admitting: Internal Medicine

## 2016-07-26 ENCOUNTER — Encounter: Payer: Self-pay | Admitting: Internal Medicine

## 2016-07-26 ENCOUNTER — Other Ambulatory Visit: Payer: Self-pay | Admitting: *Deleted

## 2016-07-26 DIAGNOSIS — I8393 Asymptomatic varicose veins of bilateral lower extremities: Secondary | ICD-10-CM | POA: Diagnosis not present

## 2016-07-26 DIAGNOSIS — K2100 Gastro-esophageal reflux disease with esophagitis, without bleeding: Secondary | ICD-10-CM

## 2016-07-26 DIAGNOSIS — I83893 Varicose veins of bilateral lower extremities with other complications: Secondary | ICD-10-CM

## 2016-07-26 DIAGNOSIS — C44712 Basal cell carcinoma of skin of right lower limb, including hip: Secondary | ICD-10-CM

## 2016-07-26 DIAGNOSIS — M810 Age-related osteoporosis without current pathological fracture: Secondary | ICD-10-CM

## 2016-07-26 DIAGNOSIS — E7800 Pure hypercholesterolemia, unspecified: Secondary | ICD-10-CM

## 2016-07-26 DIAGNOSIS — E559 Vitamin D deficiency, unspecified: Secondary | ICD-10-CM

## 2016-07-26 DIAGNOSIS — Z85828 Personal history of other malignant neoplasm of skin: Secondary | ICD-10-CM | POA: Insufficient documentation

## 2016-07-26 MED ORDER — VITAMIN D3 25 MCG (1000 UT) PO CAPS: ORAL_CAPSULE | ORAL | Status: DC

## 2016-07-26 NOTE — Assessment & Plan Note (Signed)
Prefers to avoid medication 2017: LDL 142, total cholesterol 240, triglycerides 47, HDL 89

## 2016-07-26 NOTE — Assessment & Plan Note (Addendum)
Taking AcidTamer (supplement). Uses pantoprazole as needed Fairly controlled She hopes to keep pantoprazole to a minimal

## 2016-07-26 NOTE — Assessment & Plan Note (Addendum)
Taking calcium and vitamin d Active, no regular exercise Never taken medication and does not want to dexa due 2018 Stressed the importance of regular exercise

## 2016-07-26 NOTE — Assessment & Plan Note (Signed)
Will see vascular surgery

## 2016-07-26 NOTE — Progress Notes (Signed)
Pre visit review using our clinic review tool, if applicable. No additional management support is needed unless otherwise documented below in the visit note. 

## 2016-07-26 NOTE — Assessment & Plan Note (Signed)
Vitamin D on the low side, especially given osteoporosis Advised restarting vitamin D 1000 units daily-should take around

## 2016-07-26 NOTE — Progress Notes (Signed)
Subjective:    Patient ID: Robyn Hudson, female    DOB: 1955/11/20, 60 y.o.   MRN: [id number]  HPI She is here to establish with a new pcp.   She is here for follow up.  She recently saw her gynecologist and had a complete blood work, which she brought today.  Vitamin D deficiency: She is not currently taking any vitamin D. Vitamin D level is 31. She does have osteoporosis.  GERD:  She is taking her medication daily as needed only.  She is taking a natural supplement to help with GERD.  She denies any GERD symptoms and feels her GERD is well controlled.   Osteoporosis: Her bone density is up-to-date. She is taking calcium, but not currently taking vitamin D. She is very active, but not exercising regularly.  Hyperlipidemia: Her cholesterol is slightly elevated, but previous primary care doctor thought her HDL cholesterol was high enough risk is low enough that she did not need medication.  Medications and allergies reviewed with patient and updated if appropriate.  Patient Active Problem List   Diagnosis Date Noted  . Belching 09/28/2015  . Esophageal reflux 09/28/2015  . Nausea without vomiting 09/28/2015  . Varicose veins of lower extremities with other complications [id number]  . Unspecified vitamin D deficiency 04/02/2013  . HYPERLIPIDEMIA 10/04/2010  . Osteoporosis, unspecified 10/04/2010    Current Outpatient Prescriptions on File Prior to Visit  Medication Sig Dispense Refill  . CALCIUM ASPARTATE PO Take by mouth daily.    Marland Kitchen CALCIUM-VITAMIN D PO Take by mouth daily. 500mg /100 units    . Cholecalciferol (VITAMIN D-3) 5000 UNITS TABS Take 1 tablet by mouth daily.    . clidinium-chlordiazePOXIDE (LIBRAX) 5-2.5 MG capsule Take 1 capsule by mouth 3 (three) times daily as needed. 90 capsule 0  . Ginkgo Biloba 40 MG TABS Take 120 mg by mouth daily.    Marland Kitchen GLUCOSAMINE-CHONDROITIN PO Take by mouth daily.    Marland Kitchen ibuprofen (ADVIL,MOTRIN) 600 MG tablet Take 600 mg by mouth every  6 (six) hours as needed.      Marland Kitchen OVER THE COUNTER MEDICATION Take 2 mg by mouth daily. Marvlix supplements    . OVER THE COUNTER MEDICATION Calcid antiacid pills over the counter    . pantoprazole (PROTONIX) 40 MG tablet Take 1 tablet (40 mg total) by mouth daily. 90 tablet 2  . Probiotic Product (PROBIOTIC ADVANCED PO) Take by mouth. Florify probiotic once a day     No current facility-administered medications on file prior to visit.     Past Medical History:  Diagnosis Date  . Diverticulosis   . Esophageal stricture   . GERD (gastroesophageal reflux disease)   . Hyperlipidemia    elevated due to very high HDL  . Osteoporosis   . Vitamin D deficiency     Past Surgical History:  Procedure Laterality Date  . ABDOMINAL HYSTERECTOMY     USO for dysfunctional menses & endometriosis  . COLONOSCOPY  2012   [place]  . TONSILLECTOMY AND ADENOIDECTOMY      Social History   Social History  . Marital status: Widowed    Spouse name: N/A  . Number of children: N/A  . Years of education: N/A   Social History Main Topics  . Smoking status: Never Smoker  . Smokeless tobacco: Never Used  . Alcohol use No  . Drug use: No  . Sexual activity: No   Other Topics Concern  . Not on file   Social  History Narrative  . No narrative on file    Family History  Problem Relation Age of Onset  . Lung cancer Maternal Grandfather     smoker  . COPD Paternal Grandfather     smoker  . Diabetes Neg Hx   . Stroke Neg Hx   . Heart failure Paternal Grandmother     in 32s  . Varicose Veins Brother   . Peripheral vascular disease Brother     Review of Systems  Constitutional: Negative for chills and fever.  Respiratory: Negative for cough, shortness of breath and wheezing.   Cardiovascular: Positive for leg swelling (in summer only). Negative for chest pain and palpitations.  Gastrointestinal: Negative for abdominal pain, blood in stool, constipation, diarrhea and nausea.       GERD    Genitourinary: Negative for dysuria and hematuria.  Neurological: Negative for dizziness, light-headedness and headaches.  Psychiatric/Behavioral: Negative for dysphoric mood. The patient is not nervous/anxious.        Objective:   Vitals:   07/26/16 1542  BP: 130/84  Pulse: 76  Resp: 16  Temp: 98.8 F (37.1 C)   Filed Weights   07/26/16 1542  Weight: 113 lb (51.3 kg)   Body mass index is 21.35 kg/m.   Physical Exam Constitutional: Appears well-developed and well-nourished. No distress.  HENT:  Head: Normocephalic and atraumatic.  Neck: Neck supple. No tracheal deviation present. No thyromegaly present.  No cervical lymphadenopathy Cardiovascular: Normal rate, regular rhythm and normal heart sounds.   No murmur heard. No carotid bruit .  No edema Pulmonary/Chest: Effort normal and breath sounds normal. No respiratory distress. No has no wheezes. No rales.  Skin: Skin is warm and dry. Not diaphoretic.  Psychiatric: Normal mood and affect. Behavior is normal.       Assessment & Plan:   See Problem List for Assessment and Plan of chronic medical problems.

## 2016-07-26 NOTE — Assessment & Plan Note (Signed)
See dermatology annually

## 2016-07-26 NOTE — Patient Instructions (Signed)
  All other Health Maintenance issues reviewed.   All recommended immunizations and age-appropriate screenings are up-to-date or discussed.  No immunizations administered today.   Medications reviewed and updated.  Changes include starting vitamin D 1000 units daily.    Please followup in one year for a physical

## 2016-07-28 ENCOUNTER — Encounter: Payer: Self-pay | Admitting: Vascular Surgery

## 2016-07-31 ENCOUNTER — Encounter: Payer: Self-pay | Admitting: Vascular Surgery

## 2016-08-01 ENCOUNTER — Ambulatory Visit (INDEPENDENT_AMBULATORY_CARE_PROVIDER_SITE_OTHER): Payer: BLUE CROSS/BLUE SHIELD | Admitting: Vascular Surgery

## 2016-08-01 ENCOUNTER — Ambulatory Visit (HOSPITAL_COMMUNITY)
Admission: RE | Admit: 2016-08-01 | Discharge: 2016-08-01 | Disposition: A | Discharge status: Home / Self Care | Payer: BLUE CROSS/BLUE SHIELD | Source: Ambulatory Visit | Attending: Vascular Surgery | Admitting: Vascular Surgery

## 2016-08-01 ENCOUNTER — Encounter: Payer: Self-pay | Admitting: Vascular Surgery

## 2016-08-01 VITALS — BP 116/81 | HR 78 | Temp 98.5°F | Resp 16 | Ht 61.0 in | Wt 113.1 lb

## 2016-08-01 DIAGNOSIS — I83893 Varicose veins of bilateral lower extremities with other complications: Secondary | ICD-10-CM

## 2016-08-01 DIAGNOSIS — I8393 Asymptomatic varicose veins of bilateral lower extremities: Secondary | ICD-10-CM | POA: Insufficient documentation

## 2016-08-01 DIAGNOSIS — R609 Edema, unspecified: Secondary | ICD-10-CM | POA: Diagnosis present

## 2016-08-01 NOTE — Progress Notes (Signed)
Subjective:     Patient ID: Robyn Hudson, female   DOB: 23-Nov-1955, 60 y.o.   MRN: [id number]  HPI  This 60 year old female was evaluated previously by Dr. Annamarie Major in 2015 and was found to have spider veins with no evidence of significant reflux in the great saphenous system to necessitate laser ablation. She has had progression of the spider veins in continues to complain of burning and stinging discomfort particularly in the right leg in the pretibial region. She has no history of DVT thrombophlebitis stasis ulcers or bleeding. She has had some skin cancers removed from her leg. She does not were elastic compression stockings.  Past Medical History:  Diagnosis Date  . Diverticulosis   . Esophageal stricture   . GERD (gastroesophageal reflux disease)   . Hyperlipidemia    elevated due to very high HDL  . Osteoporosis   . Vitamin D deficiency     Social History  Substance Use Topics  . Smoking status: Never Smoker  . Smokeless tobacco: Never Used  . Alcohol use No    Family History  Problem Relation Age of Onset  . Lung cancer Maternal Grandfather     smoker  . COPD Paternal Grandfather     smoker  . Heart failure Paternal Grandmother     in 67s  . Varicose Veins Brother   . Peripheral vascular disease Brother   . Diabetes Neg Hx   . Stroke Neg Hx     Allergies  Allergen Reactions  . Sulfonamide Derivatives Hives    Urticaria     Current Outpatient Prescriptions:  .  CALCIUM ASPARTATE PO, Take by mouth daily., Disp: , Rfl:  .  Cholecalciferol (VITAMIN D3) 1000 units CAPS, Taking one daily, Disp: 60 capsule, Rfl:  .  clidinium-chlordiazePOXIDE (LIBRAX) 5-2.5 MG capsule, Take 1 capsule by mouth 3 (three) times daily as needed., Disp: 90 capsule, Rfl: 0 .  ibuprofen (ADVIL,MOTRIN) 600 MG tablet, Take 600 mg by mouth every 6 (six) hours as needed.  , Disp: , Rfl:  .  OVER THE COUNTER MEDICATION, Take 2 mg by mouth daily. Marvlix supplements, Disp: , Rfl:  .   OVER THE COUNTER MEDICATION, AcidTamer, Disp: , Rfl:  .  pantoprazole (PROTONIX) 40 MG tablet, Take 1 tablet (40 mg total) by mouth daily. (Patient taking differently: Take 40 mg by mouth daily as needed. ), Disp: 90 tablet, Rfl: 2 .  Probiotic Product (PROBIOTIC ADVANCED PO), Take by mouth. Florify probiotic once a day, Disp: , Rfl:   Vitals:   08/01/16 1513  BP: 116/81  Pulse: 78  Resp: 16  Temp: 98.5 F (36.9 C)  TempSrc: Oral  SpO2: 100%  Weight: 113 lb 1.6 oz (51.3 kg)  Height: 5\' 1"  (1.549 m)    Body mass index is 21.37 kg/m.         Review of Systems Denies chest pain, dyspnea on exertion, PND, orthopnea, hemoptysis, claudication    Objective:   Physical Exam Gen. well-developed well-nourished female no apparent distress alert and oriented 3 Lungs no rhonchi or wheezing Right leg with patch of telangiectasia lateral distal thigh and right pretibial region slightly laterally about midway between ankle and knee. These cover about 3-4 cm in diameter. No bulging varicosities noted in either lower extremity. A few spider veins in the distal left thigh medially. 2+ dorsalis pedis pulse palpable bilaterally.  Today I ordered a venous duplex exam which I reviewed and interpreted. The great saphenous vein is small  in caliber bilaterally. There is some reflux in the right great saphenous vein from the junction down to the knee but it is very small caliber.    Assessment:     Bilateral spider veins right worse than left-symptomatic    Plan:     Discussed with patient possible foam sclerotherapy and she will consider this No indication for laser ablation or other treatment of saphenous systems

## 2016-08-21 DIAGNOSIS — Z85828 Personal history of other malignant neoplasm of skin: Secondary | ICD-10-CM | POA: Diagnosis not present

## 2016-08-21 DIAGNOSIS — D0462 Carcinoma in situ of skin of left upper limb, including shoulder: Secondary | ICD-10-CM | POA: Diagnosis not present

## 2016-08-21 DIAGNOSIS — D485 Neoplasm of uncertain behavior of skin: Secondary | ICD-10-CM | POA: Diagnosis not present

## 2016-08-21 DIAGNOSIS — D225 Melanocytic nevi of trunk: Secondary | ICD-10-CM | POA: Diagnosis not present

## 2016-08-21 DIAGNOSIS — L814 Other melanin hyperpigmentation: Secondary | ICD-10-CM | POA: Diagnosis not present

## 2016-08-21 DIAGNOSIS — L821 Other seborrheic keratosis: Secondary | ICD-10-CM | POA: Diagnosis not present

## 2016-08-21 DIAGNOSIS — L570 Actinic keratosis: Secondary | ICD-10-CM | POA: Diagnosis not present

## 2016-09-12 DIAGNOSIS — D0462 Carcinoma in situ of skin of left upper limb, including shoulder: Secondary | ICD-10-CM | POA: Diagnosis not present

## 2016-09-27 ENCOUNTER — Ambulatory Visit: Payer: BLUE CROSS/BLUE SHIELD | Admitting: *Deleted

## 2016-11-22 DIAGNOSIS — H40013 Open angle with borderline findings, low risk, bilateral: Secondary | ICD-10-CM | POA: Diagnosis not present

## 2016-11-22 DIAGNOSIS — H2513 Age-related nuclear cataract, bilateral: Secondary | ICD-10-CM | POA: Diagnosis not present

## 2017-03-21 DIAGNOSIS — L570 Actinic keratosis: Secondary | ICD-10-CM | POA: Diagnosis not present

## 2017-03-21 DIAGNOSIS — L820 Inflamed seborrheic keratosis: Secondary | ICD-10-CM | POA: Diagnosis not present

## 2017-03-21 DIAGNOSIS — L309 Dermatitis, unspecified: Secondary | ICD-10-CM | POA: Diagnosis not present

## 2017-03-21 DIAGNOSIS — D485 Neoplasm of uncertain behavior of skin: Secondary | ICD-10-CM | POA: Diagnosis not present

## 2017-06-05 ENCOUNTER — Encounter: Payer: Self-pay | Admitting: Internal Medicine

## 2017-06-05 ENCOUNTER — Other Ambulatory Visit (INDEPENDENT_AMBULATORY_CARE_PROVIDER_SITE_OTHER): Payer: BLUE CROSS/BLUE SHIELD

## 2017-06-05 ENCOUNTER — Ambulatory Visit (INDEPENDENT_AMBULATORY_CARE_PROVIDER_SITE_OTHER): Payer: BLUE CROSS/BLUE SHIELD | Admitting: Internal Medicine

## 2017-06-05 VITALS — BP 122/72 | HR 79 | Temp 98.1°F | Resp 16 | Ht 61.0 in | Wt 109.0 lb

## 2017-06-05 DIAGNOSIS — Z0000 Encounter for general adult medical examination without abnormal findings: Secondary | ICD-10-CM

## 2017-06-05 DIAGNOSIS — C44712 Basal cell carcinoma of skin of right lower limb, including hip: Secondary | ICD-10-CM | POA: Diagnosis not present

## 2017-06-05 DIAGNOSIS — E559 Vitamin D deficiency, unspecified: Secondary | ICD-10-CM

## 2017-06-05 DIAGNOSIS — E785 Hyperlipidemia, unspecified: Secondary | ICD-10-CM

## 2017-06-05 DIAGNOSIS — M810 Age-related osteoporosis without current pathological fracture: Secondary | ICD-10-CM

## 2017-06-05 DIAGNOSIS — M8589 Other specified disorders of bone density and structure, multiple sites: Secondary | ICD-10-CM | POA: Diagnosis not present

## 2017-06-05 DIAGNOSIS — K2100 Gastro-esophageal reflux disease with esophagitis, without bleeding: Secondary | ICD-10-CM

## 2017-06-05 DIAGNOSIS — E7800 Pure hypercholesterolemia, unspecified: Secondary | ICD-10-CM

## 2017-06-05 LAB — COMPREHENSIVE METABOLIC PANEL
ALBUMIN: 4.8 g/dL (ref 3.5–5.2)
ALK PHOS: 65 U/L (ref 39–117)
ALT: 19 U/L (ref 0–35)
AST: 20 U/L (ref 0–37)
BILIRUBIN TOTAL: 0.5 mg/dL (ref 0.2–1.2)
BUN: 11 mg/dL (ref 6–23)
CALCIUM: 9.7 mg/dL (ref 8.4–10.5)
CO2: 28 mEq/L (ref 19–32)
CREATININE: 0.63 mg/dL (ref 0.40–1.20)
Chloride: 106 mEq/L (ref 96–112)
GFR: 101.64 mL/min (ref >60.00)
Glucose, Bld: 91 mg/dL (ref 70–99)
Potassium: 4.3 mEq/L (ref 3.5–5.1)
Sodium: 141 mEq/L (ref 135–145)
TOTAL PROTEIN: 6.7 g/dL (ref 6.0–8.3)

## 2017-06-05 LAB — CBC WITH DIFFERENTIAL/PLATELET
BASOS ABS: 0 10*3/uL (ref 0.0–0.1)
Basophils Relative: 0.5 % (ref 0.0–3.0)
EOS ABS: 0.2 10*3/uL (ref 0.0–0.7)
Eosinophils Relative: 2.4 % (ref 0.0–5.0)
HEMATOCRIT: 40.2 % (ref 36.0–46.0)
HEMOGLOBIN: 13.4 g/dL (ref 12.0–15.0)
LYMPHS PCT: 31.4 % (ref 12.0–46.0)
Lymphs Abs: 2 10*3/uL (ref 0.7–4.0)
MCHC: 33.4 g/dL (ref 30.0–36.0)
MCV: 92.9 fl (ref 78.0–100.0)
MONOS PCT: 9.5 % (ref 3.0–12.0)
Monocytes Absolute: 0.6 10*3/uL (ref 0.1–1.0)
Neutro Abs: 3.6 10*3/uL (ref 1.4–7.7)
Neutrophils Relative %: 56.2 % (ref 43.0–77.0)
Platelets: 322 10*3/uL (ref 150.0–400.0)
RBC: 4.32 Mil/uL (ref 3.87–5.11)
RDW: 14.1 % (ref 11.5–15.5)
WBC: 6.4 10*3/uL (ref 4.0–10.5)

## 2017-06-05 LAB — LIPID PANEL
CHOL/HDL RATIO: 3
CHOLESTEROL: 216 mg/dL — AB (ref 0–200)
HDL: 76.8 mg/dL (ref >39.00)
LDL Cholesterol: 125 mg/dL — ABNORMAL HIGH (ref 0–99)
NonHDL: 139.22
TRIGLYCERIDES: 71 mg/dL (ref 0.0–149.0)
VLDL: 14.2 mg/dL (ref 0.0–40.0)

## 2017-06-05 LAB — VITAMIN D 25 HYDROXY (VIT D DEFICIENCY, FRACTURES): VITD: 36.33 ng/mL (ref 30.00–100.00)

## 2017-06-05 LAB — TSH: TSH: 1.65 u[IU]/mL (ref 0.35–4.50)

## 2017-06-05 NOTE — Assessment & Plan Note (Signed)
Will have dexa today Walking, more active Taking a bone supplement with cal, vitamin d, mag, vitamin k Wants to avoid medication

## 2017-06-05 NOTE — Assessment & Plan Note (Signed)
Taking vitamin d Check d level

## 2017-06-05 NOTE — Assessment & Plan Note (Signed)
Not on medication Check lipid

## 2017-06-05 NOTE — Assessment & Plan Note (Signed)
Uses a natural supplement for GERD as needed

## 2017-06-05 NOTE — Assessment & Plan Note (Signed)
Sees derm annually 

## 2017-06-05 NOTE — Progress Notes (Signed)
Subjective:    Patient ID: Robyn Hudson, female    DOB: 1956-08-28, 61 y.o.   MRN: [id number]  HPI She is here for a physical exam.     Medications and allergies reviewed with patient and updated if appropriate.  Patient Active Problem List   Diagnosis Date Noted  . Spider veins of both lower extremities 08/01/2016  . Basal cell carcinoma of leg, right 07/26/2016  . Esophageal reflux 09/28/2015  . Varicose veins of both lower extremities 04/27/2014  . Vitamin D deficiency 04/02/2013  . Hyperlipidemia 10/04/2010  . Osteoporosis 10/04/2010    Current Outpatient Prescriptions on File Prior to Visit  Medication Sig Dispense Refill  . CALCIUM ASPARTATE PO Take by mouth daily.    Marland Kitchen ibuprofen (ADVIL,MOTRIN) 600 MG tablet Take 600 mg by mouth every 6 (six) hours as needed.      . Probiotic Product (PROBIOTIC ADVANCED PO) Take by mouth. Florify probiotic once a day     No current facility-administered medications on file prior to visit.     Past Medical History:  Diagnosis Date  . Diverticulosis   . Esophageal stricture   . GERD (gastroesophageal reflux disease)   . Hyperlipidemia    elevated due to very high HDL  . Osteoporosis   . Vitamin D deficiency     Past Surgical History:  Procedure Laterality Date  . ABDOMINAL HYSTERECTOMY     USO for dysfunctional menses & endometriosis  . COLONOSCOPY  2012   [place]  . TONSILLECTOMY AND ADENOIDECTOMY      Social History   Social History  . Marital status: Widowed    Spouse name: N/A  . Number of children: N/A  . Years of education: N/A   Social History Main Topics  . Smoking status: Never Smoker  . Smokeless tobacco: Never Used  . Alcohol use No  . Drug use: No  . Sexual activity: No   Other Topics Concern  . None   Social History Narrative  . None    Family History  Problem Relation Age of Onset  . Lung cancer Maternal Grandfather        smoker  . COPD Paternal Grandfather        smoker  .  Heart failure Paternal Grandmother        in 77s  . Varicose Veins Brother   . Peripheral vascular disease Brother   . Dementia Mother        lewy body  . Diabetes Neg Hx   . Stroke Neg Hx     Review of Systems  Constitutional: Negative for chills and fever.  Eyes: Negative for visual disturbance.  Respiratory: Negative for cough, shortness of breath and wheezing.   Cardiovascular: Negative for chest pain, palpitations and leg swelling.  Gastrointestinal: Negative for abdominal pain, blood in stool, constipation, diarrhea and nausea.  Genitourinary: Negative for dysuria and hematuria.  Musculoskeletal: Negative for arthralgias and back pain.  Skin: Negative for color change and rash.  Neurological: Negative for light-headedness and headaches.  Psychiatric/Behavioral: Negative for dysphoric mood. The patient is not nervous/anxious.        Objective:   Vitals:   06/05/17 0812  BP: 122/72  Pulse: 79  Resp: 16  Temp: 98.1 F (36.7 C)  SpO2: 98%   Filed Weights   06/05/17 0812  Weight: 109 lb (49.4 kg)   Body mass index is 20.6 kg/m.  Wt Readings from Last 3 Encounters:  06/05/17 109 lb (49.4  kg)  08/01/16 113 lb 1.6 oz (51.3 kg)  07/26/16 113 lb (51.3 kg)     Physical Exam Constitutional: She appears well-developed and well-nourished. No distress.  HENT:  Head: Normocephalic and atraumatic.  Right Ear: External ear normal. Normal ear canal and TM Left Ear: External ear normal.  Normal ear canal and TM Mouth/Throat: Oropharynx is clear and moist.  Eyes: Conjunctivae and EOM are normal.  Neck: Neck supple. No tracheal deviation present. No thyromegaly present.  No carotid bruit  Cardiovascular: Normal rate, regular rhythm and normal heart sounds.   No murmur heard.  No edema. Pulmonary/Chest: Effort normal and breath sounds normal. No respiratory distress. She has no wheezes. She has no rales.  Breast: deferred to Gyn Abdominal: Soft. She exhibits no  distension. There is no tenderness.  Lymphadenopathy: She has no cervical adenopathy.  Skin: Skin is warm and dry. She is not diaphoretic.  Psychiatric: She has a normal mood and affect. Her behavior is normal.         Assessment & Plan:   Physical exam: Screening blood work  ordered Immunizations   Up to date, discussed shingrix Colonoscopy  Up to date  Mammogram   - scheduled for next week Gyn  - Up to date  - schedule for next week Dexa  Last 2016 -due - having it done today - at [facility] exams  Up to date  EKG  Last EKG 2014 - no indications to repeat Exercise  Walking and very active Weight  Normal BMI Skin  No concerns, sees derm annually Substance abuse  none  See Problem List for Assessment and Plan of chronic medical problems.  FU annually

## 2017-06-05 NOTE — Patient Instructions (Addendum)
Test(s) ordered today. Your results will be released to [place] (or called to you) after review, usually within 72hours after test completion. If any changes need to be made, you will be notified at that same time.  All other Health Maintenance issues reviewed.   All recommended immunizations and age-appropriate screenings are up-to-date or discussed.  No immunizations administered today. Consider getting the shingles vaccine.  Medications reviewed and updated.  No changes recommended at this time.   Please followup in one year    Health Maintenance, Female Adopting a healthy lifestyle and getting preventive care can go a long way to promote health and wellness. Talk with your health care provider about what schedule of regular examinations is right for you. This is a good chance for you to check in with your provider about disease prevention and staying healthy. In between checkups, there are plenty of things you can do on your own. Experts have done a lot of research about which lifestyle changes and preventive measures are most likely to keep you healthy. Ask your health care provider for more information. Weight and diet Eat a healthy diet  Be sure to include plenty of vegetables, fruits, low-fat dairy products, and lean protein.  Do not eat a lot of foods high in solid fats, added sugars, or salt.  Get regular exercise. This is one of the most important things you can do for your health. ? Most adults should exercise for at least 150 minutes each week. The exercise should increase your heart rate and make you sweat (moderate-intensity exercise). ? Most adults should also do strengthening exercises at least twice a week. This is in addition to the moderate-intensity exercise.  Maintain a healthy weight  Body mass index (BMI) is a measurement that can be used to identify possible weight problems. It estimates body fat based on height and weight. Your health care provider can help  determine your BMI and help you achieve or maintain a healthy weight.  For females 62 years of age and older: ? A BMI below 18.5 is considered underweight. ? A BMI of 18.5 to 24.9 is normal. ? A BMI of 25 to 29.9 is considered overweight. ? A BMI of 30 and above is considered obese.  Watch levels of cholesterol and blood lipids  You should start having your blood tested for lipids and cholesterol at 62 years of age, then have this test every 5 years.  You may need to have your cholesterol levels checked more often if: ? Your lipid or cholesterol levels are high. ? You are older than 62 years of age. ? You are at high risk for heart disease.  Cancer screening Lung Cancer  Lung cancer screening is recommended for adults 32-57 years old who are at high risk for lung cancer because of a history of smoking.  A yearly low-dose CT scan of the lungs is recommended for people who: ? Currently smoke. ? Have quit within the past 15 years. ? Have at least a 30-pack-year history of smoking. A pack year is smoking an average of one pack of cigarettes a day for 1 year.  Yearly screening should continue until it has been 15 years since you quit.  Yearly screening should stop if you develop a health problem that would prevent you from having lung cancer treatment.  Breast Cancer  Practice breast self-awareness. This means understanding how your breasts normally appear and feel.  It also means doing regular breast self-exams. Let your health care  provider know about any changes, no matter how small.  If you are in your 20s or 30s, you should have a clinical breast exam (CBE) by a health care provider every 1-3 years as part of a regular health exam.  If you are 27 or older, have a CBE every year. Also consider having a breast X-ray (mammogram) every year.  If you have a family history of breast cancer, talk to your health care provider about genetic screening.  If you are at high risk for  breast cancer, talk to your health care provider about having an MRI and a mammogram every year.  Breast cancer gene (BRCA) assessment is recommended for women who have family members with BRCA-related cancers. BRCA-related cancers include: ? Breast. ? Ovarian. ? Tubal. ? Peritoneal cancers.  Results of the assessment will determine the need for genetic counseling and BRCA1 and BRCA2 testing.  Cervical Cancer Your health care provider may recommend that you be screened regularly for cancer of the pelvic organs (ovaries, uterus, and vagina). This screening involves a pelvic examination, including checking for microscopic changes to the surface of your cervix (Pap test). You may be encouraged to have this screening done every 3 years, beginning at age 27.  For women ages 45-65, health care providers may recommend pelvic exams and Pap testing every 3 years, or they may recommend the Pap and pelvic exam, combined with testing for human papilloma virus (HPV), every 5 years. Some types of HPV increase your risk of cervical cancer. Testing for HPV may also be done on women of any age with unclear Pap test results.  Other health care providers may not recommend any screening for nonpregnant women who are considered low risk for pelvic cancer and who do not have symptoms. Ask your health care provider if a screening pelvic exam is right for you.  If you have had past treatment for cervical cancer or a condition that could lead to cancer, you need Pap tests and screening for cancer for at least 20 years after your treatment. If Pap tests have been discontinued, your risk factors (such as having a new sexual partner) need to be reassessed to determine if screening should resume. Some women have medical problems that increase the chance of getting cervical cancer. In these cases, your health care provider may recommend more frequent screening and Pap tests.  Colorectal Cancer  This type of cancer can be  detected and often prevented.  Routine colorectal cancer screening usually begins at 61 years of age and continues through 62 years of age.  Your health care provider may recommend screening at an earlier age if you have risk factors for colon cancer.  Your health care provider may also recommend using home test kits to check for hidden blood in the stool.  A small camera at the end of a tube can be used to examine your colon directly (sigmoidoscopy or colonoscopy). This is done to check for the earliest forms of colorectal cancer.  Routine screening usually begins at age 37.  Direct examination of the colon should be repeated every 5-10 years through 62 years of age. However, you may need to be screened more often if early forms of precancerous polyps or small growths are found.  Skin Cancer  Check your skin from head to toe regularly.  Tell your health care provider about any new moles or changes in moles, especially if there is a change in a mole's shape or color.  Also tell your  health care provider if you have a mole that is larger than the size of a pencil eraser.  Always use sunscreen. Apply sunscreen liberally and repeatedly throughout the day.  Protect yourself by wearing long sleeves, pants, a wide-brimmed hat, and sunglasses whenever you are outside.  Heart disease, diabetes, and high blood pressure  High blood pressure causes heart disease and increases the risk of stroke. High blood pressure is more likely to develop in: ? People who have blood pressure in the high end of the normal range (130-139/85-89 mm Hg). ? People who are overweight or obese. ? People who are African American.  If you are 12-7 years of age, have your blood pressure checked every 3-5 years. If you are 71 years of age or older, have your blood pressure checked every year. You should have your blood pressure measured twice-once when you are at a hospital or clinic, and once when you are not at a  hospital or clinic. Record the average of the two measurements. To check your blood pressure when you are not at a hospital or clinic, you can use: ? An automated blood pressure machine at a pharmacy. ? A home blood pressure monitor.  If you are between 44 years and 2 years old, ask your health care provider if you should take aspirin to prevent strokes.  Have regular diabetes screenings. This involves taking a blood sample to check your fasting blood sugar level. ? If you are at a normal weight and have a low risk for diabetes, have this test once every three years after 62 years of age. ? If you are overweight and have a high risk for diabetes, consider being tested at a younger age or more often. Preventing infection Hepatitis B  If you have a higher risk for hepatitis B, you should be screened for this virus. You are considered at high risk for hepatitis B if: ? You were born in a country where hepatitis B is common. Ask your health care provider which countries are considered high risk. ? Your parents were born in a high-risk country, and you have not been immunized against hepatitis B (hepatitis B vaccine). ? You have HIV or AIDS. ? You use needles to inject street drugs. ? You live with someone who has hepatitis B. ? You have had sex with someone who has hepatitis B. ? You get hemodialysis treatment. ? You take certain medicines for conditions, including cancer, organ transplantation, and autoimmune conditions.  Hepatitis C  Blood testing is recommended for: ? Everyone born from [birth year] through 1966. ? Anyone with known risk factors for hepatitis C.  Sexually transmitted infections (STIs)  You should be screened for sexually transmitted infections (STIs) including gonorrhea and chlamydia if: ? You are sexually active and are younger than 62 years of age. ? You are older than 62 years of age and your health care provider tells you that you are at risk for this type of  infection. ? Your sexual activity has changed since you were last screened and you are at an increased risk for chlamydia or gonorrhea. Ask your health care provider if you are at risk.  If you do not have HIV, but are at risk, it may be recommended that you take a prescription medicine daily to prevent HIV infection. This is called pre-exposure prophylaxis (PrEP). You are considered at risk if: ? You are sexually active and do not regularly use condoms or know the HIV status of your partner(s). ?  You take drugs by injection. ? You are sexually active with a partner who has HIV.  Talk with your health care provider about whether you are at high risk of being infected with HIV. If you choose to begin PrEP, you should first be tested for HIV. You should then be tested every 3 months for as long as you are taking PrEP. Pregnancy  If you are premenopausal and you may become pregnant, ask your health care provider about preconception counseling.  If you may become pregnant, take 400 to 800 micrograms (mcg) of folic acid every day.  If you want to prevent pregnancy, talk to your health care provider about birth control (contraception). Osteoporosis and menopause  Osteoporosis is a disease in which the bones lose minerals and strength with aging. This can result in serious bone fractures. Your risk for osteoporosis can be identified using a bone density scan.  If you are 72 years of age or older, or if you are at risk for osteoporosis and fractures, ask your health care provider if you should be screened.  Ask your health care provider whether you should take a calcium or vitamin D supplement to lower your risk for osteoporosis.  Menopause may have certain physical symptoms and risks.  Hormone replacement therapy may reduce some of these symptoms and risks. Talk to your health care provider about whether hormone replacement therapy is right for you. Follow these instructions at home:  Schedule  regular health, dental, and eye exams.  Stay current with your immunizations.  Do not use any tobacco products including cigarettes, chewing tobacco, or electronic cigarettes.  If you are pregnant, do not drink alcohol.  If you are breastfeeding, limit how much and how often you drink alcohol.  Limit alcohol intake to no more than 1 drink per day for nonpregnant women. One drink equals 12 ounces of beer, 5 ounces of wine, or 1 ounces of hard liquor.  Do not use street drugs.  Do not share needles.  Ask your health care provider for help if you need support or information about quitting drugs.  Tell your health care provider if you often feel depressed.  Tell your health care provider if you have ever been abused or do not feel safe at home. This information is not intended to replace advice given to you by your health care provider. Make sure you discuss any questions you have with your health care provider. Document Released: 04/24/2011 Document Revised: 03/16/2016 Document Reviewed: 07/13/2015 Elsevier Interactive Patient Education  [employer].

## 2017-06-06 ENCOUNTER — Telehealth: Payer: Self-pay | Admitting: Internal Medicine

## 2017-06-06 NOTE — Telephone Encounter (Signed)
Pt called stating she has contacted her insurance company regarding Shingrix, they will cover it and she would like to get it. I notifed her we do not have any. Can she be put on a list or can this be sent to a pharmacy? I told patient we would call her back.

## 2017-06-10 ENCOUNTER — Encounter: Payer: Self-pay | Admitting: Internal Medicine

## 2017-06-15 ENCOUNTER — Encounter: Payer: BLUE CROSS/BLUE SHIELD | Admitting: Internal Medicine

## 2017-06-15 DIAGNOSIS — Z01419 Encounter for gynecological examination (general) (routine) without abnormal findings: Secondary | ICD-10-CM | POA: Diagnosis not present

## 2017-06-15 DIAGNOSIS — Z1231 Encounter for screening mammogram for malignant neoplasm of breast: Secondary | ICD-10-CM | POA: Diagnosis not present

## 2017-06-15 DIAGNOSIS — Z6821 Body mass index (BMI) 21.0-21.9, adult: Secondary | ICD-10-CM | POA: Diagnosis not present

## 2017-06-15 DIAGNOSIS — Z1389 Encounter for screening for other disorder: Secondary | ICD-10-CM | POA: Diagnosis not present

## 2017-06-15 DIAGNOSIS — E559 Vitamin D deficiency, unspecified: Secondary | ICD-10-CM | POA: Diagnosis not present

## 2017-06-15 LAB — HM MAMMOGRAPHY

## 2017-06-15 NOTE — Telephone Encounter (Signed)
Pt called checking on the results from her Bone Density. Have we received them from [place]?

## 2017-06-27 ENCOUNTER — Encounter: Payer: Self-pay | Admitting: Internal Medicine

## 2017-10-08 DIAGNOSIS — L821 Other seborrheic keratosis: Secondary | ICD-10-CM | POA: Diagnosis not present

## 2017-10-08 DIAGNOSIS — L918 Other hypertrophic disorders of the skin: Secondary | ICD-10-CM | POA: Diagnosis not present

## 2017-10-08 DIAGNOSIS — D485 Neoplasm of uncertain behavior of skin: Secondary | ICD-10-CM | POA: Diagnosis not present

## 2017-10-08 DIAGNOSIS — D225 Melanocytic nevi of trunk: Secondary | ICD-10-CM | POA: Diagnosis not present

## 2017-10-08 DIAGNOSIS — L570 Actinic keratosis: Secondary | ICD-10-CM | POA: Diagnosis not present

## 2017-10-08 DIAGNOSIS — L820 Inflamed seborrheic keratosis: Secondary | ICD-10-CM | POA: Diagnosis not present

## 2018-06-05 NOTE — Patient Instructions (Addendum)
Test(s) ordered today. Your results will be released to [facility] (or called to you) after review, usually within 72hours after test completion. If any changes need to be made, you will be notified at that same time.  All other Health Maintenance issues reviewed.   All recommended immunizations and age-appropriate screenings are up-to-date or discussed.  No immunizations administered today.   Medications reviewed and updated.  No changes recommended at this time.   Please followup in one year   Health Maintenance, Female Adopting a healthy lifestyle and getting preventive care can go a long way to promote health and wellness. Talk with your health care provider about what schedule of regular examinations is right for you. This is a good chance for you to check in with your provider about disease prevention and staying healthy. In between checkups, there are plenty of things you can do on your own. Experts have done a lot of research about which lifestyle changes and preventive measures are most likely to keep you healthy. Ask your health care provider for more information. Weight and diet Eat a healthy diet  Be sure to include plenty of vegetables, fruits, low-fat dairy products, and lean protein.  Do not eat a lot of foods high in solid fats, added sugars, or salt.  Get regular exercise. This is one of the most important things you can do for your health. ? Most adults should exercise for at least 150 minutes each week. The exercise should increase your heart rate and make you sweat (moderate-intensity exercise). ? Most adults should also do strengthening exercises at least twice a week. This is in addition to the moderate-intensity exercise.  Maintain a healthy weight  Body mass index (BMI) is a measurement that can be used to identify possible weight problems. It estimates body fat based on height and weight. Your health care provider can help determine your BMI and help you achieve or  maintain a healthy weight.  For females 64 years of age and older: ? A BMI below 18.5 is considered underweight. ? A BMI of 18.5 to 24.9 is normal. ? A BMI of 25 to 29.9 is considered overweight. ? A BMI of 30 and above is considered obese.  Watch levels of cholesterol and blood lipids  You should start having your blood tested for lipids and cholesterol at 63 years of age, then have this test every 5 years.  You may need to have your cholesterol levels checked more often if: ? Your lipid or cholesterol levels are high. ? You are older than 63 years of age. ? You are at high risk for heart disease.  Cancer screening Lung Cancer  Lung cancer screening is recommended for adults 72-7 years old who are at high risk for lung cancer because of a history of smoking.  A yearly low-dose CT scan of the lungs is recommended for people who: ? Currently smoke. ? Have quit within the past 15 years. ? Have at least a 30-pack-year history of smoking. A pack year is smoking an average of one pack of cigarettes a day for 1 year.  Yearly screening should continue until it has been 15 years since you quit.  Yearly screening should stop if you develop a health problem that would prevent you from having lung cancer treatment.  Breast Cancer  Practice breast self-awareness. This means understanding how your breasts normally appear and feel.  It also means doing regular breast self-exams. Let your health care provider know about any changes,  no matter how small.  If you are in your 20s or 30s, you should have a clinical breast exam (CBE) by a health care provider every 1-3 years as part of a regular health exam.  If you are 40 or older, have a CBE every year. Also consider having a breast X-ray (mammogram) every year.  If you have a family history of breast cancer, talk to your health care provider about genetic screening.  If you are at high risk for breast cancer, talk to your health care  provider about having an MRI and a mammogram every year.  Breast cancer gene (BRCA) assessment is recommended for women who have family members with BRCA-related cancers. BRCA-related cancers include: ? Breast. ? Ovarian. ? Tubal. ? Peritoneal cancers.  Results of the assessment will determine the need for genetic counseling and BRCA1 and BRCA2 testing.  Cervical Cancer Your health care provider may recommend that you be screened regularly for cancer of the pelvic organs (ovaries, uterus, and vagina). This screening involves a pelvic examination, including checking for microscopic changes to the surface of your cervix (Pap test). You may be encouraged to have this screening done every 3 years, beginning at age 21.  For women ages 30-65, health care providers may recommend pelvic exams and Pap testing every 3 years, or they may recommend the Pap and pelvic exam, combined with testing for human papilloma virus (HPV), every 5 years. Some types of HPV increase your risk of cervical cancer. Testing for HPV may also be done on women of any age with unclear Pap test results.  Other health care providers may not recommend any screening for nonpregnant women who are considered low risk for pelvic cancer and who do not have symptoms. Ask your health care provider if a screening pelvic exam is right for you.  If you have had past treatment for cervical cancer or a condition that could lead to cancer, you need Pap tests and screening for cancer for at least 20 years after your treatment. If Pap tests have been discontinued, your risk factors (such as having a new sexual partner) need to be reassessed to determine if screening should resume. Some women have medical problems that increase the chance of getting cervical cancer. In these cases, your health care provider may recommend more frequent screening and Pap tests.  Colorectal Cancer  This type of cancer can be detected and often prevented.  Routine  colorectal cancer screening usually begins at 63 years of age and continues through 63 years of age.  Your health care provider may recommend screening at an earlier age if you have risk factors for colon cancer.  Your health care provider may also recommend using home test kits to check for hidden blood in the stool.  A small camera at the end of a tube can be used to examine your colon directly (sigmoidoscopy or colonoscopy). This is done to check for the earliest forms of colorectal cancer.  Routine screening usually begins at age 50.  Direct examination of the colon should be repeated every 5-10 years through 63 years of age. However, you may need to be screened more often if early forms of precancerous polyps or small growths are found.  Skin Cancer  Check your skin from head to toe regularly.  Tell your health care provider about any new moles or changes in moles, especially if there is a change in a mole's shape or color.  Also tell your health care provider if you   have a mole that is larger than the size of a pencil eraser.  Always use sunscreen. Apply sunscreen liberally and repeatedly throughout the day.  Protect yourself by wearing long sleeves, pants, a wide-brimmed hat, and sunglasses whenever you are outside.  Heart disease, diabetes, and high blood pressure  High blood pressure causes heart disease and increases the risk of stroke. High blood pressure is more likely to develop in: ? People who have blood pressure in the high end of the normal range (130-139/85-89 mm Hg). ? People who are overweight or obese. ? People who are African American.  If you are 24-25 years of age, have your blood pressure checked every 3-5 years. If you are 2 years of age or older, have your blood pressure checked every year. You should have your blood pressure measured twice-once when you are at a hospital or clinic, and once when you are not at a hospital or clinic. Record the average of the  two measurements. To check your blood pressure when you are not at a hospital or clinic, you can use: ? An automated blood pressure machine at a pharmacy. ? A home blood pressure monitor.  If you are between 42 years and 59 years old, ask your health care provider if you should take aspirin to prevent strokes.  Have regular diabetes screenings. This involves taking a blood sample to check your fasting blood sugar level. ? If you are at a normal weight and have a low risk for diabetes, have this test once every three years after 63 years of age. ? If you are overweight and have a high risk for diabetes, consider being tested at a younger age or more often. Preventing infection Hepatitis B  If you have a higher risk for hepatitis B, you should be screened for this virus. You are considered at high risk for hepatitis B if: ? You were born in a country where hepatitis B is common. Ask your health care provider which countries are considered high risk. ? Your parents were born in a high-risk country, and you have not been immunized against hepatitis B (hepatitis B vaccine). ? You have HIV or AIDS. ? You use needles to inject street drugs. ? You live with someone who has hepatitis B. ? You have had sex with someone who has hepatitis B. ? You get hemodialysis treatment. ? You take certain medicines for conditions, including cancer, organ transplantation, and autoimmune conditions.  Hepatitis C  Blood testing is recommended for: ? Everyone born from [birth year] through 1966. ? Anyone with known risk factors for hepatitis C.  Sexually transmitted infections (STIs)  You should be screened for sexually transmitted infections (STIs) including gonorrhea and chlamydia if: ? You are sexually active and are younger than 63 years of age. ? You are older than 63 years of age and your health care provider tells you that you are at risk for this type of infection. ? Your sexual activity has changed since you  were last screened and you are at an increased risk for chlamydia or gonorrhea. Ask your health care provider if you are at risk.  If you do not have HIV, but are at risk, it may be recommended that you take a prescription medicine daily to prevent HIV infection. This is called pre-exposure prophylaxis (PrEP). You are considered at risk if: ? You are sexually active and do not regularly use condoms or know the HIV status of your partner(s). ? You take drugs by injection. ?  You are sexually active with a partner who has HIV.  Talk with your health care provider about whether you are at high risk of being infected with HIV. If you choose to begin PrEP, you should first be tested for HIV. You should then be tested every 3 months for as long as you are taking PrEP. Pregnancy  If you are premenopausal and you may become pregnant, ask your health care provider about preconception counseling.  If you may become pregnant, take 400 to 800 micrograms (mcg) of folic acid every day.  If you want to prevent pregnancy, talk to your health care provider about birth control (contraception). Osteoporosis and menopause  Osteoporosis is a disease in which the bones lose minerals and strength with aging. This can result in serious bone fractures. Your risk for osteoporosis can be identified using a bone density scan.  If you are 65 years of age or older, or if you are at risk for osteoporosis and fractures, ask your health care provider if you should be screened.  Ask your health care provider whether you should take a calcium or vitamin D supplement to lower your risk for osteoporosis.  Menopause may have certain physical symptoms and risks.  Hormone replacement therapy may reduce some of these symptoms and risks. Talk to your health care provider about whether hormone replacement therapy is right for you. Follow these instructions at home:  Schedule regular health, dental, and eye exams.  Stay current  with your immunizations.  Do not use any tobacco products including cigarettes, chewing tobacco, or electronic cigarettes.  If you are pregnant, do not drink alcohol.  If you are breastfeeding, limit how much and how often you drink alcohol.  Limit alcohol intake to no more than 1 drink per day for nonpregnant women. One drink equals 12 ounces of beer, 5 ounces of wine, or 1 ounces of hard liquor.  Do not use street drugs.  Do not share needles.  Ask your health care provider for help if you need support or information about quitting drugs.  Tell your health care provider if you often feel depressed.  Tell your health care provider if you have ever been abused or do not feel safe at home. This information is not intended to replace advice given to you by your health care provider. Make sure you discuss any questions you have with your health care provider. Document Released: 04/24/2011 Document Revised: 03/16/2016 Document Reviewed: 07/13/2015 Elsevier Interactive Patient Education  2018 [employer]  

## 2018-06-05 NOTE — Progress Notes (Signed)
Subjective:    Patient ID: Robyn Hudson, female    DOB: 23-Jul-1956, 62 y.o.   MRN: [id number]  HPI She is here for a physical exam.   She denies any changes in her health since last year and has no concerns.  She feels well.    Medications and allergies reviewed with patient and updated if appropriate.  Patient Active Problem List   Diagnosis Date Noted  . Spider veins of both lower extremities 08/01/2016  . Basal cell carcinoma of leg, right 07/26/2016  . Esophageal reflux 09/28/2015  . Varicose veins of both lower extremities 04/27/2014  . Vitamin D deficiency 04/02/2013  . Hyperlipidemia 10/04/2010  . Osteoporosis 10/04/2010    Current Outpatient Medications on File Prior to Visit  Medication Sig Dispense Refill  . CALCIUM ASPARTATE PO Take by mouth daily.    Marland Kitchen ibuprofen (ADVIL,MOTRIN) 600 MG tablet Take 600 mg by mouth every 6 (six) hours as needed.      . Probiotic Product (PROBIOTIC ADVANCED PO) Take by mouth. Florify probiotic once a day     No current facility-administered medications on file prior to visit.     Past Medical History:  Diagnosis Date  . Diverticulosis   . Esophageal stricture   . GERD (gastroesophageal reflux disease)   . Hyperlipidemia    elevated due to very high HDL  . Osteoporosis   . Vitamin D deficiency     Past Surgical History:  Procedure Laterality Date  . ABDOMINAL HYSTERECTOMY     USO for dysfunctional menses & endometriosis  . COLONOSCOPY  2012   [place]  . TONSILLECTOMY AND ADENOIDECTOMY      Social History   Socioeconomic History  . Marital status: Widowed    Spouse name: Not on file  . Number of children: Not on file  . Years of education: Not on file  . Highest education level: Not on file  Occupational History  . Not on file  Social Needs  . Financial resource strain: Not on file  . Food insecurity:    Worry: Not on file    Inability: Not on file  . Transportation needs:    Medical: Not on file   Non-medical: Not on file  Tobacco Use  . Smoking status: Never Smoker  . Smokeless tobacco: Never Used  Substance and Sexual Activity  . Alcohol use: No  . Drug use: No  . Sexual activity: Never  Lifestyle  . Physical activity:    Days per week: Not on file    Minutes per session: Not on file  . Stress: Not on file  Relationships  . Social connections:    Talks on phone: Not on file    Gets together: Not on file    Attends religious service: Not on file    Active member of club or organization: Not on file    Attends meetings of clubs or organizations: Not on file    Relationship status: Not on file  Other Topics Concern  . Not on file  Social History Narrative  . Not on file    Family History  Problem Relation Age of Onset  . Lung cancer Maternal Grandfather        smoker  . COPD Paternal Grandfather        smoker  . Heart failure Paternal Grandmother        in 69s  . Varicose Veins Brother   . Peripheral vascular disease Brother   .  Dementia Mother        lewy body  . Diabetes Neg Hx   . Stroke Neg Hx     Review of Systems  Constitutional: Negative for chills, fatigue and fever.  Eyes: Negative for visual disturbance.  Respiratory: Negative for cough, shortness of breath and wheezing.   Cardiovascular: Negative for chest pain, palpitations and leg swelling.  Gastrointestinal: Negative for abdominal pain, blood in stool, constipation, diarrhea and nausea.       No gerd  Genitourinary: Negative for dysuria and hematuria.  Musculoskeletal: Negative for arthralgias and back pain.  Skin: Negative for color change and rash.  Neurological: Negative for light-headedness and headaches.  Psychiatric/Behavioral: Negative for dysphoric mood and sleep disturbance. The patient is not nervous/anxious.        Objective:   Vitals:   06/06/18 0810  BP: 118/84  Pulse: 75  Resp: 16  Temp: 97.9 F (36.6 C)  SpO2: 98%   Filed Weights   06/06/18 0810  Weight: 110 lb  (49.9 kg)   Body mass index is 20.78 kg/m.  Wt Readings from Last 3 Encounters:  06/06/18 110 lb (49.9 kg)  06/05/17 109 lb (49.4 kg)  08/01/16 113 lb 1.6 oz (51.3 kg)     Physical Exam Constitutional: She appears well-developed and well-nourished. No distress.  HENT:  Head: Normocephalic and atraumatic.  Right Ear: External ear normal. Normal ear canal and TM Left Ear: External ear normal.  Normal ear canal and TM Mouth/Throat: Oropharynx is clear and moist.  Eyes: Conjunctivae and EOM are normal.  Neck: Neck supple. No tracheal deviation present. No thyromegaly present.  No carotid bruit  Cardiovascular: Normal rate, regular rhythm and normal heart sounds.   No murmur heard.  No edema. Pulmonary/Chest: Effort normal and breath sounds normal. No respiratory distress. She has no wheezes. She has no rales.  Breast: deferred to Gyn Abdominal: Soft. She exhibits no distension. There is no tenderness.  Lymphadenopathy: She has no cervical adenopathy.  Skin: Skin is warm and dry. She is not diaphoretic.  Psychiatric: She has a normal mood and affect. Her behavior is normal.        Assessment & Plan:   Physical exam: Screening blood work ordered Immunizations   Discussed shingrix, others up to date Colonoscopy    Up to date  Mammogram    Up to date  Gyn   Up to date  Dexa   Up to date  Eye exams   Up to date  EKG     Done in 2014 Exercise  Walking 3 miles a few times a weekand very active Weight   Normal BMI Skin   No concerns, sees derm Substance abuse    none  See Problem List for Assessment and Plan of chronic medical problems.    FU in one year

## 2018-06-06 ENCOUNTER — Encounter: Payer: Self-pay | Admitting: Internal Medicine

## 2018-06-06 ENCOUNTER — Other Ambulatory Visit (INDEPENDENT_AMBULATORY_CARE_PROVIDER_SITE_OTHER): Payer: BLUE CROSS/BLUE SHIELD

## 2018-06-06 ENCOUNTER — Ambulatory Visit (INDEPENDENT_AMBULATORY_CARE_PROVIDER_SITE_OTHER): Payer: BLUE CROSS/BLUE SHIELD | Admitting: Internal Medicine

## 2018-06-06 VITALS — BP 118/84 | HR 75 | Temp 97.9°F | Resp 16 | Wt 110.0 lb

## 2018-06-06 DIAGNOSIS — E782 Mixed hyperlipidemia: Secondary | ICD-10-CM | POA: Diagnosis not present

## 2018-06-06 DIAGNOSIS — M810 Age-related osteoporosis without current pathological fracture: Secondary | ICD-10-CM | POA: Diagnosis not present

## 2018-06-06 DIAGNOSIS — Z0000 Encounter for general adult medical examination without abnormal findings: Secondary | ICD-10-CM

## 2018-06-06 LAB — CBC WITH DIFFERENTIAL/PLATELET
BASOS ABS: 0.1 10*3/uL (ref 0.0–0.1)
Basophils Relative: 0.8 % (ref 0.0–3.0)
EOS ABS: 0.2 10*3/uL (ref 0.0–0.7)
Eosinophils Relative: 3 % (ref 0.0–5.0)
HCT: 38.6 % (ref 36.0–46.0)
HEMOGLOBIN: 13.1 g/dL (ref 12.0–15.0)
Lymphocytes Relative: 31.8 % (ref 12.0–46.0)
Lymphs Abs: 2 10*3/uL (ref 0.7–4.0)
MCHC: 34.1 g/dL (ref 30.0–36.0)
MCV: 91.7 fl (ref 78.0–100.0)
MONO ABS: 0.7 10*3/uL (ref 0.1–1.0)
Monocytes Relative: 11.2 % (ref 3.0–12.0)
Neutro Abs: 3.3 10*3/uL (ref 1.4–7.7)
Neutrophils Relative %: 53.2 % (ref 43.0–77.0)
Platelets: 317 10*3/uL (ref 150.0–400.0)
RBC: 4.21 Mil/uL (ref 3.87–5.11)
RDW: 14.1 % (ref 11.5–15.5)
WBC: 6.3 10*3/uL (ref 4.0–10.5)

## 2018-06-06 LAB — LIPID PANEL
CHOLESTEROL: 241 mg/dL — AB (ref 0–200)
HDL: 79 mg/dL (ref >39.00)
LDL Cholesterol: 150 mg/dL — ABNORMAL HIGH (ref 0–99)
NonHDL: 162.27
Total CHOL/HDL Ratio: 3
Triglycerides: 59 mg/dL (ref 0.0–149.0)
VLDL: 11.8 mg/dL (ref 0.0–40.0)

## 2018-06-06 LAB — COMPREHENSIVE METABOLIC PANEL
ALBUMIN: 4.5 g/dL (ref 3.5–5.2)
ALK PHOS: 66 U/L (ref 39–117)
ALT: 18 U/L (ref 0–35)
AST: 20 U/L (ref 0–37)
BILIRUBIN TOTAL: 0.6 mg/dL (ref 0.2–1.2)
BUN: 17 mg/dL (ref 6–23)
CO2: 28 mEq/L (ref 19–32)
CREATININE: 0.67 mg/dL (ref 0.40–1.20)
Calcium: 9.7 mg/dL (ref 8.4–10.5)
Chloride: 103 mEq/L (ref 96–112)
GFR: 94.37 mL/min (ref >60.00)
Glucose, Bld: 87 mg/dL (ref 70–99)
Potassium: 4 mEq/L (ref 3.5–5.1)
SODIUM: 139 meq/L (ref 135–145)
TOTAL PROTEIN: 7 g/dL (ref 6.0–8.3)

## 2018-06-06 LAB — TSH: TSH: 1.5 u[IU]/mL (ref 0.35–4.50)

## 2018-06-06 NOTE — Assessment & Plan Note (Signed)
dexa up to date - due next year Continue calcium and vitamin d Continue regular walking

## 2018-06-06 NOTE — Assessment & Plan Note (Signed)
Check lipid panel,cmp ,tsh Continue daily statin Regular exercise and healthy diet encouraged  

## 2018-06-07 ENCOUNTER — Encounter: Payer: Self-pay | Admitting: Internal Medicine

## 2018-06-26 DIAGNOSIS — Z1389 Encounter for screening for other disorder: Secondary | ICD-10-CM | POA: Diagnosis not present

## 2018-06-26 DIAGNOSIS — Z1231 Encounter for screening mammogram for malignant neoplasm of breast: Secondary | ICD-10-CM | POA: Diagnosis not present

## 2018-06-26 DIAGNOSIS — Z01419 Encounter for gynecological examination (general) (routine) without abnormal findings: Secondary | ICD-10-CM | POA: Diagnosis not present

## 2018-06-26 DIAGNOSIS — Z6820 Body mass index (BMI) 20.0-20.9, adult: Secondary | ICD-10-CM | POA: Diagnosis not present

## 2018-06-26 LAB — HM MAMMOGRAPHY

## 2018-07-04 ENCOUNTER — Encounter: Payer: Self-pay | Admitting: Internal Medicine

## 2018-11-22 DIAGNOSIS — H0102A Squamous blepharitis right eye, upper and lower eyelids: Secondary | ICD-10-CM | POA: Diagnosis not present

## 2018-11-22 DIAGNOSIS — H40013 Open angle with borderline findings, low risk, bilateral: Secondary | ICD-10-CM | POA: Diagnosis not present

## 2018-11-22 DIAGNOSIS — H0102B Squamous blepharitis left eye, upper and lower eyelids: Secondary | ICD-10-CM | POA: Diagnosis not present

## 2018-11-22 DIAGNOSIS — H2513 Age-related nuclear cataract, bilateral: Secondary | ICD-10-CM | POA: Diagnosis not present

## 2019-04-14 DIAGNOSIS — N949 Unspecified condition associated with female genital organs and menstrual cycle: Secondary | ICD-10-CM | POA: Diagnosis not present

## 2019-05-14 DIAGNOSIS — I781 Nevus, non-neoplastic: Secondary | ICD-10-CM | POA: Diagnosis not present

## 2019-05-14 DIAGNOSIS — L821 Other seborrheic keratosis: Secondary | ICD-10-CM | POA: Diagnosis not present

## 2019-05-14 DIAGNOSIS — Z808 Family history of malignant neoplasm of other organs or systems: Secondary | ICD-10-CM | POA: Diagnosis not present

## 2019-05-14 DIAGNOSIS — L814 Other melanin hyperpigmentation: Secondary | ICD-10-CM | POA: Diagnosis not present

## 2019-05-14 DIAGNOSIS — L570 Actinic keratosis: Secondary | ICD-10-CM | POA: Diagnosis not present

## 2019-06-08 NOTE — Patient Instructions (Addendum)
Tests ordered today. Your results will be released to [place] (or called to you) after review.  If any changes need to be made, you will be notified at that same time.  All other Health Maintenance issues reviewed.   All recommended immunizations and age-appropriate screenings are up-to-date or discussed.  No immunization administered today.   Medications reviewed and updated.  Changes include :   none   Please followup in 1 year   Health Maintenance, Female Adopting a healthy lifestyle and getting preventive care are important in promoting health and wellness. Ask your health care provider about:  The right schedule for you to have regular tests and exams.  Things you can do on your own to prevent diseases and keep yourself healthy. What should I know about diet, weight, and exercise? Eat a healthy diet   Eat a diet that includes plenty of vegetables, fruits, low-fat dairy products, and lean protein.  Do not eat a lot of foods that are high in solid fats, added sugars, or sodium. Maintain a healthy weight Body mass index (BMI) is used to identify weight problems. It estimates body fat based on height and weight. Your health care provider can help determine your BMI and help you achieve or maintain a healthy weight. Get regular exercise Get regular exercise. This is one of the most important things you can do for your health. Most adults should:  Exercise for at least 150 minutes each week. The exercise should increase your heart rate and make you sweat (moderate-intensity exercise).  Do strengthening exercises at least twice a week. This is in addition to the moderate-intensity exercise.  Spend less time sitting. Even light physical activity can be beneficial. Watch cholesterol and blood lipids Have your blood tested for lipids and cholesterol at 64 years of age, then have this test every 5 years. Have your cholesterol levels checked more often if:  Your lipid or cholesterol  levels are high.  You are older than 64 years of age.  You are at high risk for heart disease. What should I know about cancer screening? Depending on your health history and family history, you may need to have cancer screening at various ages. This may include screening for:  Breast cancer.  Cervical cancer.  Colorectal cancer.  Skin cancer.  Lung cancer. What should I know about heart disease, diabetes, and high blood pressure? Blood pressure and heart disease  High blood pressure causes heart disease and increases the risk of stroke. This is more likely to develop in people who have high blood pressure readings, are of African descent, or are overweight.  Have your blood pressure checked: ? Every 3-5 years if you are 39-85 years of age. ? Every year if you are 12 years old or older. Diabetes Have regular diabetes screenings. This checks your fasting blood sugar level. Have the screening done:  Once every three years after age 68 if you are at a normal weight and have a low risk for diabetes.  More often and at a younger age if you are overweight or have a high risk for diabetes. What should I know about preventing infection? Hepatitis B If you have a higher risk for hepatitis B, you should be screened for this virus. Talk with your health care provider to find out if you are at risk for hepatitis B infection. Hepatitis C Testing is recommended for:  Everyone born from [birth year] through 1965.  Anyone with known risk factors for hepatitis C. Sexually transmitted  infections (STIs)  Get screened for STIs, including gonorrhea and chlamydia, if: ? You are sexually active and are younger than 64 years of age. ? You are older than 64 years of age and your health care provider tells you that you are at risk for this type of infection. ? Your sexual activity has changed since you were last screened, and you are at increased risk for chlamydia or gonorrhea. Ask your health care  provider if you are at risk.  Ask your health care provider about whether you are at high risk for HIV. Your health care provider may recommend a prescription medicine to help prevent HIV infection. If you choose to take medicine to prevent HIV, you should first get tested for HIV. You should then be tested every 3 months for as long as you are taking the medicine. Pregnancy  If you are about to stop having your period (premenopausal) and you may become pregnant, seek counseling before you get pregnant.  Take 400 to 800 micrograms (mcg) of folic acid every day if you become pregnant.  Ask for birth control (contraception) if you want to prevent pregnancy. Osteoporosis and menopause Osteoporosis is a disease in which the bones lose minerals and strength with aging. This can result in bone fractures. If you are 59 years old or older, or if you are at risk for osteoporosis and fractures, ask your health care provider if you should:  Be screened for bone loss.  Take a calcium or vitamin D supplement to lower your risk of fractures.  Be given hormone replacement therapy (HRT) to treat symptoms of menopause. Follow these instructions at home: Lifestyle  Do not use any products that contain nicotine or tobacco, such as cigarettes, e-cigarettes, and chewing tobacco. If you need help quitting, ask your health care provider.  Do not use street drugs.  Do not share needles.  Ask your health care provider for help if you need support or information about quitting drugs. Alcohol use  Do not drink alcohol if: ? Your health care provider tells you not to drink. ? You are pregnant, may be pregnant, or are planning to become pregnant.  If you drink alcohol: ? Limit how much you use to 0-1 drink a day. ? Limit intake if you are breastfeeding.  Be aware of how much alcohol is in your drink. In the [place] one drink equals one 12 oz bottle of beer (355 mL), one 5 oz glass of wine (148 mL), or one 1  oz glass of hard liquor (44 mL). General instructions  Schedule regular health, dental, and eye exams.  Stay current with your vaccines.  Tell your health care provider if: ? You often feel depressed. ? You have ever been abused or do not feel safe at home. Summary  Adopting a healthy lifestyle and getting preventive care are important in promoting health and wellness.  Follow your health care provider's instructions about healthy diet, exercising, and getting tested or screened for diseases.  Follow your health care provider's instructions on monitoring your cholesterol and blood pressure. This information is not intended to replace advice given to you by your health care provider. Make sure you discuss any questions you have with your health care provider. Document Released: 04/24/2011 Document Revised: 10/02/2018 Document Reviewed: 10/02/2018 Elsevier Patient Education  2020 [employer].

## 2019-06-08 NOTE — Progress Notes (Signed)
Subjective:    Patient ID: Robyn Hudson, female    DOB: 12/19/1955, 62 y.o.   MRN: [id number]  HPI She is here for a physical exam.   Doing well.  Denies changes in health and has no concerns.   Medications and allergies reviewed with patient and updated if appropriate.  Patient Active Problem List   Diagnosis Date Noted  . Spider veins of both lower extremities 08/01/2016  . Basal cell carcinoma of leg, right 07/26/2016  . Esophageal reflux 09/28/2015  . Varicose veins of both lower extremities 04/27/2014  . Vitamin D deficiency 04/02/2013  . Hyperlipidemia 10/04/2010  . Osteoporosis 10/04/2010    Current Outpatient Medications on File Prior to Visit  Medication Sig Dispense Refill  . CALCIUM ASPARTATE PO Take by mouth daily.    Marland Kitchen ibuprofen (ADVIL,MOTRIN) 600 MG tablet Take 600 mg by mouth every 6 (six) hours as needed.      . Probiotic Product (PROBIOTIC ADVANCED PO) Take by mouth. Florify probiotic once a day    . valACYclovir (VALTREX) 1000 MG tablet TK 1 T PO Q 12 H FOR 3 DAYS PRN     No current facility-administered medications on file prior to visit.     Past Medical History:  Diagnosis Date  . Diverticulosis   . Esophageal stricture   . GERD (gastroesophageal reflux disease)   . Hyperlipidemia    elevated due to very high HDL  . Osteoporosis   . Vitamin D deficiency     Past Surgical History:  Procedure Laterality Date  . ABDOMINAL HYSTERECTOMY     [facility]O for dysfunctional menses & endometriosis  . COLONOSCOPY  2012   [place]  . TONSILLECTOMY AND ADENOIDECTOMY      Social History   Socioeconomic History  . Marital status: Widowed    Spouse name: Not on file  . Number of children: Not on file  . Years of education: Not on file  . Highest education level: Not on file  Occupational History  . Not on file  Social Needs  . Financial resource strain: Not on file  . Food insecurity    Worry: Not on file    Inability: Not on file  .  Transportation needs    Medical: Not on file    Non-medical: Not on file  Tobacco Use  . Smoking status: Never Smoker  . Smokeless tobacco: Never Used  Substance and Sexual Activity  . Alcohol use: No  . Drug use: No  . Sexual activity: Never  Lifestyle  . Physical activity    Days per week: Not on file    Minutes per session: Not on file  . Stress: Not on file  Relationships  . Social Herbalist on phone: Not on file    Gets together: Not on file    Attends religious service: Not on file    Active member of club or organization: Not on file    Attends meetings of clubs or organizations: Not on file    Relationship status: Not on file  Other Topics Concern  . Not on file  Social History Narrative  . Not on file    Family History  Problem Relation Age of Onset  . Lung cancer Maternal Grandfather        smoker  . COPD Paternal Grandfather        smoker  . Heart failure Paternal Grandmother        in 62s  .  Varicose Veins Brother   . Peripheral vascular disease Brother   . Dementia Mother        lewy body  . Diabetes Neg Hx   . Stroke Neg Hx     Review of Systems  Constitutional: Negative for chills and fever.  Eyes: Negative for visual disturbance.  Respiratory: Negative for cough, shortness of breath and wheezing.   Cardiovascular: Negative for chest pain, palpitations and leg swelling.  Gastrointestinal: Negative for abdominal pain, blood in stool, constipation, diarrhea and nausea.       No gerd  Genitourinary: Negative for dysuria and hematuria.  Musculoskeletal: Negative for arthralgias and back pain.  Skin: Negative for color change and rash.  Neurological: Negative for dizziness, light-headedness, numbness and headaches.  Psychiatric/Behavioral: Negative for dysphoric mood. The patient is not nervous/anxious.        Objective:   Vitals:   06/10/19 0757  BP: 138/78  Pulse: 74  Resp: 16  Temp: 98.4 F (36.9 C)  SpO2: 98%   Filed  Weights   06/10/19 0757  Weight: 108 lb (49 kg)   Body mass index is 20.41 kg/m.  BP Readings from Last 3 Encounters:  06/10/19 138/78  06/06/18 118/84  06/05/17 122/72    Wt Readings from Last 3 Encounters:  06/10/19 108 lb (49 kg)  06/06/18 110 lb (49.9 kg)  06/05/17 109 lb (49.4 kg)     Physical Exam Constitutional: She appears well-developed and well-nourished. No distress.  HENT:  Head: Normocephalic and atraumatic.  Right Ear: External ear normal. Normal ear canal and TM Left Ear: External ear normal.  Normal ear canal and TM Mouth/Throat: Oropharynx is clear and moist.  Eyes: Conjunctivae and EOM are normal.  Neck: Neck supple. No tracheal deviation present. No thyromegaly present.  No carotid bruit  Cardiovascular: Normal rate, regular rhythm and normal heart sounds.   No murmur heard.  No edema. Pulmonary/Chest: Effort normal and breath sounds normal. No respiratory distress. She has no wheezes. She has no rales.  Breast: deferred   Abdominal: Soft. She exhibits no distension. There is no tenderness.  Lymphadenopathy: She has no cervical adenopathy.  Skin: Skin is warm and dry. She is not diaphoretic.  Psychiatric: She has a normal mood and affect. Her behavior is normal.        Assessment & Plan:   Physical exam: Screening blood work ordered Immunizations    Discussed shingrix, others up to date Colonoscopy   Up to date  Mammogram    Up to date  Gyn  Up to date -Kenton Kingfisher, NP - scheduled Dexa   Due - [facility] - ordered Eye exams      Up to date  Exercise    Walking regularly, 2.7 miles 2-4 times a week Weight  Normal BMI Skin    Sees derm, no concerns today Substance abuse   none  See Problem List for Assessment and Plan of chronic medical problems.   FU in one year

## 2019-06-10 ENCOUNTER — Other Ambulatory Visit (INDEPENDENT_AMBULATORY_CARE_PROVIDER_SITE_OTHER): Payer: BC Managed Care – PPO

## 2019-06-10 ENCOUNTER — Ambulatory Visit (INDEPENDENT_AMBULATORY_CARE_PROVIDER_SITE_OTHER): Payer: BC Managed Care – PPO | Admitting: Internal Medicine

## 2019-06-10 ENCOUNTER — Encounter: Payer: Self-pay | Admitting: Internal Medicine

## 2019-06-10 ENCOUNTER — Other Ambulatory Visit: Payer: Self-pay

## 2019-06-10 VITALS — BP 138/78 | HR 74 | Temp 98.4°F | Resp 16 | Ht 61.0 in | Wt 108.0 lb

## 2019-06-10 DIAGNOSIS — M810 Age-related osteoporosis without current pathological fracture: Secondary | ICD-10-CM

## 2019-06-10 DIAGNOSIS — E782 Mixed hyperlipidemia: Secondary | ICD-10-CM | POA: Diagnosis not present

## 2019-06-10 DIAGNOSIS — Z8262 Family history of osteoporosis: Secondary | ICD-10-CM | POA: Diagnosis not present

## 2019-06-10 DIAGNOSIS — Z0000 Encounter for general adult medical examination without abnormal findings: Secondary | ICD-10-CM

## 2019-06-10 DIAGNOSIS — K2100 Gastro-esophageal reflux disease with esophagitis, without bleeding: Secondary | ICD-10-CM

## 2019-06-10 DIAGNOSIS — C44712 Basal cell carcinoma of skin of right lower limb, including hip: Secondary | ICD-10-CM

## 2019-06-10 DIAGNOSIS — E559 Vitamin D deficiency, unspecified: Secondary | ICD-10-CM | POA: Diagnosis not present

## 2019-06-10 DIAGNOSIS — Z90710 Acquired absence of both cervix and uterus: Secondary | ICD-10-CM | POA: Diagnosis not present

## 2019-06-10 LAB — LIPID PANEL
Cholesterol: 235 mg/dL — ABNORMAL HIGH (ref 0–200)
HDL: 74.7 mg/dL (ref >39.00)
LDL Cholesterol: 147 mg/dL — ABNORMAL HIGH (ref 0–99)
NonHDL: 159.91
Total CHOL/HDL Ratio: 3
Triglycerides: 65 mg/dL (ref 0.0–149.0)
VLDL: 13 mg/dL (ref 0.0–40.0)

## 2019-06-10 LAB — CBC WITH DIFFERENTIAL/PLATELET
Basophils Absolute: 0.1 10*3/uL (ref 0.0–0.1)
Basophils Relative: 0.8 % (ref 0.0–3.0)
Eosinophils Absolute: 0.1 10*3/uL (ref 0.0–0.7)
Eosinophils Relative: 1.5 % (ref 0.0–5.0)
HCT: 39.9 % (ref 36.0–46.0)
Hemoglobin: 13.3 g/dL (ref 12.0–15.0)
Lymphocytes Relative: 27.5 % (ref 12.0–46.0)
Lymphs Abs: 1.8 10*3/uL (ref 0.7–4.0)
MCHC: 33.3 g/dL (ref 30.0–36.0)
MCV: 93 fl (ref 78.0–100.0)
Monocytes Absolute: 0.6 10*3/uL (ref 0.1–1.0)
Monocytes Relative: 9.5 % (ref 3.0–12.0)
Neutro Abs: 4 10*3/uL (ref 1.4–7.7)
Neutrophils Relative %: 60.7 % (ref 43.0–77.0)
Platelets: 312 10*3/uL (ref 150.0–400.0)
RBC: 4.29 Mil/uL (ref 3.87–5.11)
RDW: 15 % (ref 11.5–15.5)
WBC: 6.6 10*3/uL (ref 4.0–10.5)

## 2019-06-10 LAB — COMPREHENSIVE METABOLIC PANEL
ALT: 18 U/L (ref 0–35)
AST: 20 U/L (ref 0–37)
Albumin: 4.6 g/dL (ref 3.5–5.2)
Alkaline Phosphatase: 56 U/L (ref 39–117)
BUN: 13 mg/dL (ref 6–23)
CO2: 27 mEq/L (ref 19–32)
Calcium: 9.4 mg/dL (ref 8.4–10.5)
Chloride: 105 mEq/L (ref 96–112)
Creatinine, Ser: 0.65 mg/dL (ref 0.40–1.20)
GFR: 91.65 mL/min (ref >60.00)
Glucose, Bld: 91 mg/dL (ref 70–99)
Potassium: 4.4 mEq/L (ref 3.5–5.1)
Sodium: 140 mEq/L (ref 135–145)
Total Bilirubin: 0.6 mg/dL (ref 0.2–1.2)
Total Protein: 7.1 g/dL (ref 6.0–8.3)

## 2019-06-10 LAB — TSH: TSH: 1.2 u[IU]/mL (ref 0.35–4.50)

## 2019-06-10 LAB — HM DEXA SCAN

## 2019-06-10 LAB — VITAMIN D 25 HYDROXY (VIT D DEFICIENCY, FRACTURES): VITD: 41.26 ng/mL (ref 30.00–100.00)

## 2019-06-10 NOTE — Assessment & Plan Note (Signed)
Check lipid panel, cmp, tsh  Lifestyle controlled Regular exercise and healthy diet encouraged

## 2019-06-10 NOTE — Assessment & Plan Note (Signed)
Sees derm annually 

## 2019-06-10 NOTE — Assessment & Plan Note (Signed)
Denies gerd -controlled with diet and natural supplements

## 2019-06-10 NOTE — Assessment & Plan Note (Signed)
Does not tolerated vitamin d supplements -- advised to try different supplements - should be taking with OP Check level

## 2019-06-10 NOTE — Assessment & Plan Note (Signed)
dexa due - has appt today at [facility] taking calcium - does not tolerate vitamin d -- will try a different type of supplement Check D level Walking regularly

## 2019-06-11 ENCOUNTER — Encounter: Payer: Self-pay | Admitting: Internal Medicine

## 2019-06-18 ENCOUNTER — Encounter: Payer: Self-pay | Admitting: Internal Medicine

## 2019-06-23 DIAGNOSIS — L570 Actinic keratosis: Secondary | ICD-10-CM | POA: Diagnosis not present

## 2019-07-01 DIAGNOSIS — Z01419 Encounter for gynecological examination (general) (routine) without abnormal findings: Secondary | ICD-10-CM | POA: Diagnosis not present

## 2019-07-01 DIAGNOSIS — A609 Anogenital herpesviral infection, unspecified: Secondary | ICD-10-CM | POA: Diagnosis not present

## 2019-07-01 DIAGNOSIS — Z130 Encounter for screening for diseases of the blood and blood-forming organs and certain disorders involving the immune mechanism: Secondary | ICD-10-CM | POA: Diagnosis not present

## 2019-07-01 DIAGNOSIS — R102 Pelvic and perineal pain: Secondary | ICD-10-CM | POA: Diagnosis not present

## 2019-07-01 DIAGNOSIS — Z1231 Encounter for screening mammogram for malignant neoplasm of breast: Secondary | ICD-10-CM | POA: Diagnosis not present

## 2019-07-01 LAB — HM MAMMOGRAPHY

## 2019-07-08 DIAGNOSIS — R102 Pelvic and perineal pain: Secondary | ICD-10-CM | POA: Diagnosis not present

## 2019-07-10 ENCOUNTER — Other Ambulatory Visit: Payer: Self-pay | Admitting: Obstetrics and Gynecology

## 2019-07-10 DIAGNOSIS — R1031 Right lower quadrant pain: Secondary | ICD-10-CM

## 2019-07-10 DIAGNOSIS — R102 Pelvic and perineal pain: Secondary | ICD-10-CM

## 2019-07-17 ENCOUNTER — Other Ambulatory Visit: Payer: Self-pay | Admitting: Obstetrics and Gynecology

## 2019-07-17 DIAGNOSIS — R102 Pelvic and perineal pain: Secondary | ICD-10-CM

## 2019-07-17 DIAGNOSIS — R1031 Right lower quadrant pain: Secondary | ICD-10-CM

## 2019-07-17 DIAGNOSIS — K6289 Other specified diseases of anus and rectum: Secondary | ICD-10-CM

## 2019-07-18 ENCOUNTER — Ambulatory Visit
Admission: RE | Admit: 2019-07-18 | Discharge: 2019-07-18 | Disposition: A | Discharge status: Home / Self Care | Payer: BC Managed Care – PPO | Source: Ambulatory Visit | Attending: Obstetrics and Gynecology | Admitting: Obstetrics and Gynecology

## 2019-07-18 DIAGNOSIS — R102 Pelvic and perineal pain: Secondary | ICD-10-CM

## 2019-07-18 DIAGNOSIS — K7689 Other specified diseases of liver: Secondary | ICD-10-CM | POA: Diagnosis not present

## 2019-07-18 DIAGNOSIS — R1031 Right lower quadrant pain: Secondary | ICD-10-CM

## 2019-07-18 DIAGNOSIS — K6289 Other specified diseases of anus and rectum: Secondary | ICD-10-CM

## 2019-07-18 DIAGNOSIS — K760 Fatty (change of) liver, not elsewhere classified: Secondary | ICD-10-CM | POA: Diagnosis not present

## 2019-07-18 MED ORDER — IOPAMIDOL (ISOVUE-300) INJECTION 61%
100.0000 mL | Freq: Once | INTRAVENOUS | Status: AC | PRN
  Administered 2019-07-18: 100 mL via INTRAVENOUS

## 2019-07-23 ENCOUNTER — Encounter: Payer: Self-pay | Admitting: Internal Medicine

## 2019-07-27 ENCOUNTER — Encounter: Payer: Self-pay | Admitting: Internal Medicine

## 2019-12-10 DIAGNOSIS — L0100 Impetigo, unspecified: Secondary | ICD-10-CM | POA: Diagnosis not present

## 2019-12-10 DIAGNOSIS — D2239 Melanocytic nevi of other parts of face: Secondary | ICD-10-CM | POA: Diagnosis not present

## 2019-12-10 DIAGNOSIS — L089 Local infection of the skin and subcutaneous tissue, unspecified: Secondary | ICD-10-CM | POA: Diagnosis not present

## 2019-12-10 DIAGNOSIS — L309 Dermatitis, unspecified: Secondary | ICD-10-CM | POA: Diagnosis not present

## 2020-03-02 ENCOUNTER — Telehealth: Payer: Self-pay | Admitting: Internal Medicine

## 2020-03-02 NOTE — Telephone Encounter (Signed)
New message:   Pt is calling and states she has a question about the vaccination.

## 2020-03-02 NOTE — Telephone Encounter (Signed)
Spoke with pt in regards. She asked a while back about her getting the vaccine due to her having a very bad reaction to the flu shot and ending up in the hospital for 4-5 days. She was told to wait bc the CDC guidelines did not recommend ppl with prior reactions to vaccines to take it. She is wanting to know if you still feel the same way about that.

## 2020-03-02 NOTE — Telephone Encounter (Signed)
Per the CDC it is ok to get the covid vaccine if you have had a reaction to any other vaccine, but should be observed longer ( 30 minutes instead of 15 minutes)

## 2020-03-03 NOTE — Telephone Encounter (Signed)
Pt aware of response below.  

## 2020-03-04 ENCOUNTER — Ambulatory Visit: Payer: BC Managed Care – PPO | Attending: Internal Medicine

## 2020-03-04 DIAGNOSIS — Z23 Encounter for immunization: Secondary | ICD-10-CM

## 2020-03-04 NOTE — Progress Notes (Signed)
   Covid-19 Vaccination Clinic  Name:  Robyn Hudson    MRN: [id number] DOB: 1956/02/10  03/04/2020  [name] was observed post Covid-19 immunization for 30 minutes based on pre-vaccination screening without incident. She was provided with Vaccine Information Sheet and instruction to access the V-Safe system.   [name] was instructed to call 911 with any severe reactions post vaccine: Marland Kitchen Difficulty breathing  . Swelling of face and throat  . A fast heartbeat  . A bad rash all over body  . Dizziness and weakness   Immunizations Administered    Name Date Dose VIS Date Route   Pfizer COVID-19 Vaccine 03/04/2020  9:43 AM 0.3 mL 12/17/2018 Intramuscular   Manufacturer: [place]   Lot: [id number]   [place]: [id number]

## 2020-03-29 ENCOUNTER — Ambulatory Visit: Payer: BC Managed Care – PPO | Attending: Internal Medicine

## 2020-03-29 DIAGNOSIS — Z23 Encounter for immunization: Secondary | ICD-10-CM

## 2020-03-29 NOTE — Progress Notes (Signed)
   Covid-19 Vaccination Clinic  Name:  Robyn Hudson    MRN: [id number] DOB: [birth year]  03/29/2020  Robyn Hudson was observed post Covid-19 immunization for 15 minutes without incident. She was provided with Vaccine Information Sheet and instruction to access the V-Safe system.   Robyn Hudson was instructed to call 911 with any severe reactions post vaccine: Marland Kitchen Difficulty breathing  . Swelling of face and throat  . A fast heartbeat  . A bad rash all over body  . Dizziness and weakness   Immunizations Administered    Name Date Dose VIS Date Route   Pfizer COVID-19 Vaccine 03/29/2020  9:34 AM 0.3 mL 12/17/2018 Intramuscular   Manufacturer: [employer]   Lot: [id number]   [place]: [id number]

## 2020-06-10 ENCOUNTER — Encounter: Payer: BC Managed Care – PPO | Admitting: Internal Medicine

## 2020-08-09 NOTE — Patient Instructions (Addendum)
Blood work was ordered.    All other Health Maintenance issues reviewed.   All recommended immunizations and age-appropriate screenings are up-to-date or discussed.  No immunization administered today.   Medications reviewed and updated.  Changes include :   none     Please followup in 1 year    Health Maintenance, Female Adopting a healthy lifestyle and getting preventive care are important in promoting health and wellness. Ask your health care provider about:  The right schedule for you to have regular tests and exams.  Things you can do on your own to prevent diseases and keep yourself healthy. What should I know about diet, weight, and exercise? Eat a healthy diet   Eat a diet that includes plenty of vegetables, fruits, low-fat dairy products, and lean protein.  Do not eat a lot of foods that are high in solid fats, added sugars, or sodium. Maintain a healthy weight Body mass index (BMI) is used to identify weight problems. It estimates body fat based on height and weight. Your health care provider can help determine your BMI and help you achieve or maintain a healthy weight. Get regular exercise Get regular exercise. This is one of the most important things you can do for your health. Most adults should:  Exercise for at least 150 minutes each week. The exercise should increase your heart rate and make you sweat (moderate-intensity exercise).  Do strengthening exercises at least twice a week. This is in addition to the moderate-intensity exercise.  Spend less time sitting. Even light physical activity can be beneficial. Watch cholesterol and blood lipids Have your blood tested for lipids and cholesterol at 65 years of age, then have this test every 5 years. Have your cholesterol levels checked more often if:  Your lipid or cholesterol levels are high.  You are older than 65 years of age.  You are at high risk for heart disease. What should I know about cancer  screening? Depending on your health history and family history, you may need to have cancer screening at various ages. This may include screening for:  Breast cancer.  Cervical cancer.  Colorectal cancer.  Skin cancer.  Lung cancer. What should I know about heart disease, diabetes, and high blood pressure? Blood pressure and heart disease  High blood pressure causes heart disease and increases the risk of stroke. This is more likely to develop in people who have high blood pressure readings, are of African descent, or are overweight.  Have your blood pressure checked: ? Every 3-5 years if you are 18-39 years of age. ? Every year if you are 40 years old or older. Diabetes Have regular diabetes screenings. This checks your fasting blood sugar level. Have the screening done:  Once every three years after age 40 if you are at a normal weight and have a low risk for diabetes.  More often and at a younger age if you are overweight or have a high risk for diabetes. What should I know about preventing infection? Hepatitis B If you have a higher risk for hepatitis B, you should be screened for this virus. Talk with your health care provider to find out if you are at risk for hepatitis B infection. Hepatitis C Testing is recommended for:  Everyone born from 1945 through 1965.  Anyone with known risk factors for hepatitis C. Sexually transmitted infections (STIs)  Get screened for STIs, including gonorrhea and chlamydia, if: ? You are sexually active and are younger than 65 years   of age. ? You are older than 65 years of age and your health care provider tells you that you are at risk for this type of infection. ? Your sexual activity has changed since you were last screened, and you are at increased risk for chlamydia or gonorrhea. Ask your health care provider if you are at risk.  Ask your health care provider about whether you are at high risk for HIV. Your health care provider may  recommend a prescription medicine to help prevent HIV infection. If you choose to take medicine to prevent HIV, you should first get tested for HIV. You should then be tested every 3 months for as long as you are taking the medicine. Pregnancy  If you are about to stop having your period (premenopausal) and you may become pregnant, seek counseling before you get pregnant.  Take 400 to 800 micrograms (mcg) of folic acid every day if you become pregnant.  Ask for birth control (contraception) if you want to prevent pregnancy. Osteoporosis and menopause Osteoporosis is a disease in which the bones lose minerals and strength with aging. This can result in bone fractures. If you are 65 years old or older, or if you are at risk for osteoporosis and fractures, ask your health care provider if you should:  Be screened for bone loss.  Take a calcium or vitamin D supplement to lower your risk of fractures.  Be given hormone replacement therapy (HRT) to treat symptoms of menopause. Follow these instructions at home: Lifestyle  Do not use any products that contain nicotine or tobacco, such as cigarettes, e-cigarettes, and chewing tobacco. If you need help quitting, ask your health care provider.  Do not use street drugs.  Do not share needles.  Ask your health care provider for help if you need support or information about quitting drugs. Alcohol use  Do not drink alcohol if: ? Your health care provider tells you not to drink. ? You are pregnant, may be pregnant, or are planning to become pregnant.  If you drink alcohol: ? Limit how much you use to 0-1 drink a day. ? Limit intake if you are breastfeeding.  Be aware of how much alcohol is in your drink. In the [place] one drink equals one 12 oz bottle of beer (355 mL), one 5 oz glass of wine (148 mL), or one 1 oz glass of hard liquor (44 mL). General instructions  Schedule regular health, dental, and eye exams.  Stay current with your  vaccines.  Tell your health care provider if: ? You often feel depressed. ? You have ever been abused or do not feel safe at home. Summary  Adopting a healthy lifestyle and getting preventive care are important in promoting health and wellness.  Follow your health care provider's instructions about healthy diet, exercising, and getting tested or screened for diseases.  Follow your health care provider's instructions on monitoring your cholesterol and blood pressure. This information is not intended to replace advice given to you by your health care provider. Make sure you discuss any questions you have with your health care provider. Document Revised: 10/02/2018 Document Reviewed: 10/02/2018 Elsevier Patient Education  2020 [employer]  

## 2020-08-09 NOTE — Progress Notes (Signed)
Subjective:    Patient ID: Robyn Hudson, female    DOB: 1956-03-25, 64 y.o.   MRN: [id number]   This visit occurred during the SARS-CoV-2 public health emergency.  Safety protocols were in place, including screening questions prior to the visit, additional usage of staff PPE, and extensive cleaning of exam room while observing appropriate contact time as indicated for disinfecting solutions.    HPI She is here for a physical exam.    She has had one week of urinary frequency an dysuria.  She has been taking azo and her symptoms have improved.  Her last infection was 5 years ago.  She drinks a lot of water.      Medications and allergies reviewed with patient and updated if appropriate.  Patient Active Problem List   Diagnosis Date Noted  . Dysuria 08/10/2020  . Spider veins of both lower extremities 08/01/2016  . Basal cell carcinoma of leg, right 07/26/2016  . Esophageal reflux 09/28/2015  . Varicose veins of both lower extremities 04/27/2014  . Vitamin D deficiency 04/02/2013  . Hyperlipidemia 10/04/2010  . Osteoporosis 10/04/2010    Current Outpatient Medications on File Prior to Visit  Medication Sig Dispense Refill  . CALCIUM ASPARTATE PO Take by mouth daily.    . hyoscyamine (LEVSIN SL) 0.125 MG SL tablet Place under the tongue daily as needed.    Marland Kitchen ibuprofen (ADVIL,MOTRIN) 600 MG tablet Take 600 mg by mouth every 6 (six) hours as needed.      . Probiotic Product (PROBIOTIC ADVANCED PO) Take by mouth. Florify probiotic once a day    . valACYclovir (VALTREX) 1000 MG tablet TK 1 T PO Q 12 H FOR 3 DAYS PRN    . valACYclovir (VALTREX) 500 MG tablet Take 500 mg by mouth 2 (two) times daily.     No current facility-administered medications on file prior to visit.    Past Medical History:  Diagnosis Date  . Diverticulosis   . Esophageal stricture   . GERD (gastroesophageal reflux disease)   . Hyperlipidemia    elevated due to very high HDL  . Osteoporosis     . Vitamin D deficiency     Past Surgical History:  Procedure Laterality Date  . ABDOMINAL HYSTERECTOMY     USO for dysfunctional menses & endometriosis  . COLONOSCOPY  2012   [facility]  . TONSILLECTOMY AND ADENOIDECTOMY      Social History   Socioeconomic History  . Marital status: Married    Spouse name: Not on file  . Number of children: Not on file  . Years of education: Not on file  . Highest education level: Not on file  Occupational History  . Not on file  Tobacco Use  . Smoking status: Never Smoker  . Smokeless tobacco: Never Used  Substance and Sexual Activity  . Alcohol use: No  . Drug use: No  . Sexual activity: Never  Other Topics Concern  . Not on file  Social History Narrative  . Not on file   Social Determinants of Health   Financial Resource Strain:   . Difficulty of Paying Living Expenses: Not on file  Food Insecurity:   . Worried About Charity fundraiser in the Last Year: Not on file  . Ran Out of Food in the Last Year: Not on file  Transportation Needs:   . Lack of Transportation (Medical): Not on file  . Lack of Transportation (Non-Medical): Not on file  Physical Activity:   .  Days of Exercise per Week: Not on file  . Minutes of Exercise per Session: Not on file  Stress:   . Feeling of Stress : Not on file  Social Connections:   . Frequency of Communication with Friends and Family: Not on file  . Frequency of Social Gatherings with Friends and Family: Not on file  . Attends Religious Services: Not on file  . Active Member of Clubs or Organizations: Not on file  . Attends Archivist Meetings: Not on file  . Marital Status: Not on file    Family History  Problem Relation Age of Onset  . Lung cancer Maternal Grandfather        smoker  . COPD Paternal Grandfather        smoker  . Heart failure Paternal Grandmother        in 24s  . Varicose Veins Brother   . Peripheral vascular disease Brother   . Dementia Mother         lewy body  . Diabetes Neg Hx   . Stroke Neg Hx     Review of Systems  Constitutional: Negative for chills and fever.  Eyes: Negative for visual disturbance.  Respiratory: Negative for cough, shortness of breath and wheezing.   Cardiovascular: Negative for chest pain, palpitations and leg swelling.  Gastrointestinal: Positive for abdominal pain (occ epigastric pain if she eats something she shouldn't). Negative for blood in stool, constipation, diarrhea and nausea.       No gerd  Genitourinary: Positive for dysuria and frequency.  Musculoskeletal: Negative for arthralgias and back pain.  Skin: Negative for rash.  Neurological: Negative for dizziness, light-headedness and headaches.  Psychiatric/Behavioral: Negative for dysphoric mood. The patient is not nervous/anxious.        Objective:   Vitals:   08/10/20 1320  BP: 128/78  Pulse: 73  Temp: 98 F (36.7 C)  SpO2: 97%   Filed Weights   08/10/20 1320  Weight: 111 lb (50.3 kg)   Body mass index is 20.97 kg/m.  BP Readings from Last 3 Encounters:  08/10/20 128/78  06/10/19 138/78  06/06/18 118/84    Wt Readings from Last 3 Encounters:  08/10/20 111 lb (50.3 kg)  06/10/19 108 lb (49 kg)  06/06/18 110 lb (49.9 kg)     Physical Exam Constitutional: She appears well-developed and well-nourished. No distress.  HENT:  Head: Normocephalic and atraumatic.  Right Ear: External ear normal. Normal ear canal and TM Left Ear: External ear normal.  Normal ear canal and TM Mouth/Throat: Oropharynx is clear and moist.  Eyes: Conjunctivae and EOM are normal.  Neck: Neck supple. No tracheal deviation present. No thyromegaly present.  No carotid bruit  Cardiovascular: Normal rate, regular rhythm and normal heart sounds.   No murmur heard.  No edema. Pulmonary/Chest: Effort normal and breath sounds normal. No respiratory distress. She has no wheezes. She has no rales.  Breast: deferred   Abdominal: Soft. She exhibits no  distension. There is no tenderness.  Lymphadenopathy: She has no cervical adenopathy.  Skin: Skin is warm and dry. She is not diaphoretic.  Psychiatric: She has a normal mood and affect. Her behavior is normal.        Assessment & Plan:   Physical exam: Screening blood work    ordered Immunizations  Flu vac deferred, prevnar deferred, deferred shingrix Colonoscopy  Up to date  Mammogram  Up to date  Gyn  Up to date  Dexa  Up to date  -  OP - deferred medication Eye exams  Up to date  Exercise  Walking  Weight  normal Substance abuse  none      See Problem List for Assessment and Plan of chronic medical problems.

## 2020-08-10 ENCOUNTER — Encounter: Payer: Self-pay | Admitting: Internal Medicine

## 2020-08-10 ENCOUNTER — Ambulatory Visit (INDEPENDENT_AMBULATORY_CARE_PROVIDER_SITE_OTHER): Payer: Medicare Other | Admitting: Internal Medicine

## 2020-08-10 ENCOUNTER — Other Ambulatory Visit: Payer: Self-pay

## 2020-08-10 VITALS — BP 128/78 | HR 73 | Temp 98.0°F | Ht 61.0 in | Wt 111.0 lb

## 2020-08-10 DIAGNOSIS — Z0000 Encounter for general adult medical examination without abnormal findings: Secondary | ICD-10-CM

## 2020-08-10 DIAGNOSIS — M810 Age-related osteoporosis without current pathological fracture: Secondary | ICD-10-CM | POA: Diagnosis not present

## 2020-08-10 DIAGNOSIS — E559 Vitamin D deficiency, unspecified: Secondary | ICD-10-CM

## 2020-08-10 DIAGNOSIS — E782 Mixed hyperlipidemia: Secondary | ICD-10-CM | POA: Diagnosis not present

## 2020-08-10 DIAGNOSIS — R300 Dysuria: Secondary | ICD-10-CM | POA: Diagnosis not present

## 2020-08-10 LAB — COMPREHENSIVE METABOLIC PANEL
ALT: 15 U/L (ref 0–35)
AST: 21 U/L (ref 0–37)
Albumin: 4.2 g/dL (ref 3.5–5.2)
Alkaline Phosphatase: 66 U/L (ref 39–117)
BUN: 12 mg/dL (ref 6–23)
CO2: 28 mEq/L (ref 19–32)
Calcium: 9.6 mg/dL (ref 8.4–10.5)
Chloride: 105 mEq/L (ref 96–112)
Creatinine, Ser: 0.57 mg/dL (ref 0.40–1.20)
GFR: 96.89 mL/min (ref >60.00)
Glucose, Bld: 81 mg/dL (ref 70–99)
Potassium: 3.9 mEq/L (ref 3.5–5.1)
Sodium: 140 mEq/L (ref 135–145)
Total Bilirubin: 0.4 mg/dL (ref 0.2–1.2)
Total Protein: 6.8 g/dL (ref 6.0–8.3)

## 2020-08-10 LAB — LIPID PANEL
Cholesterol: 202 mg/dL — ABNORMAL HIGH (ref 0–200)
HDL: 71.8 mg/dL (ref >39.00)
LDL Cholesterol: 113 mg/dL — ABNORMAL HIGH (ref 0–99)
NonHDL: 129.97
Total CHOL/HDL Ratio: 3
Triglycerides: 86 mg/dL (ref 0.0–149.0)
VLDL: 17.2 mg/dL (ref 0.0–40.0)

## 2020-08-10 LAB — CBC WITH DIFFERENTIAL/PLATELET
Basophils Absolute: 0.1 10*3/uL (ref 0.0–0.1)
Basophils Relative: 0.9 % (ref 0.0–3.0)
Eosinophils Absolute: 0.2 10*3/uL (ref 0.0–0.7)
Eosinophils Relative: 2.8 % (ref 0.0–5.0)
HCT: 36.6 % (ref 36.0–46.0)
Hemoglobin: 12.3 g/dL (ref 12.0–15.0)
Lymphocytes Relative: 34 % (ref 12.0–46.0)
Lymphs Abs: 2.5 10*3/uL (ref 0.7–4.0)
MCHC: 33.7 g/dL (ref 30.0–36.0)
MCV: 91 fl (ref 78.0–100.0)
Monocytes Absolute: 0.7 10*3/uL (ref 0.1–1.0)
Monocytes Relative: 9.9 % (ref 3.0–12.0)
Neutro Abs: 3.8 10*3/uL (ref 1.4–7.7)
Neutrophils Relative %: 52.4 % (ref 43.0–77.0)
Platelets: 343 10*3/uL (ref 150.0–400.0)
RBC: 4.02 Mil/uL (ref 3.87–5.11)
RDW: 14.5 % (ref 11.5–15.5)
WBC: 7.2 10*3/uL (ref 4.0–10.5)

## 2020-08-10 LAB — TSH: TSH: 1.05 u[IU]/mL (ref 0.35–4.50)

## 2020-08-10 LAB — POC URINALSYSI DIPSTICK (AUTOMATED)
Bilirubin, UA: NEGATIVE
Blood, UA: NEGATIVE
Glucose, UA: NEGATIVE
Ketones, UA: NEGATIVE
Leukocytes, UA: NEGATIVE
Nitrite, UA: NEGATIVE
Protein, UA: NEGATIVE
Spec Grav, UA: 1.015 (ref 1.010–1.025)
Urobilinogen, UA: 0.2 E.U./dL
pH, UA: 6 (ref 5.0–8.0)

## 2020-08-10 LAB — VITAMIN D 25 HYDROXY (VIT D DEFICIENCY, FRACTURES): VITD: 29.01 ng/mL — ABNORMAL LOW (ref 30.00–100.00)

## 2020-08-10 NOTE — Assessment & Plan Note (Addendum)
Chronic Has osteoporosis Does not tolerated vitamin d - has side effects from it Check vitamin d level

## 2020-08-10 NOTE — Addendum Note (Signed)
Addended by: Boris Lown B on: 08/10/2020 02:08 PM   Modules accepted: Orders

## 2020-08-10 NOTE — Addendum Note (Signed)
Addended by: Marcina Millard on: 08/10/2020 04:11 PM   Modules accepted: Orders

## 2020-08-10 NOTE — Assessment & Plan Note (Signed)
Acute Started one week ago Associated with dysuria, frequency Urine dip is negative Will send for culture Azo prn

## 2020-08-10 NOTE — Assessment & Plan Note (Addendum)
Chronic dexa up to date Taking calcium Does not tolerate vitamin d due to side effects Walking regularly Will check vitamin d level Deferred any medication for her bones

## 2020-08-10 NOTE — Addendum Note (Signed)
Addended by: Cresenciano Lick on: 08/10/2020 04:16 PM   Modules accepted: Orders

## 2020-08-10 NOTE — Assessment & Plan Note (Signed)
Chronic Check lipid panel  Lifestyle controlled Regular exercise and healthy diet encouraged  

## 2020-08-11 ENCOUNTER — Encounter: Payer: Self-pay | Admitting: Internal Medicine

## 2020-08-11 LAB — URINE CULTURE: Result:: NO GROWTH

## 2020-08-26 IMAGING — CT CT ABD-PELV W/ CM
2 of 5 series · 12 of 46 positions shown, 14 images · IV contrast (iopamidol)
Comparison: None.

CLINICAL DATA: Pain during routine exam 1 month ago. Right lower
quadrant pain.

EXAM:
CT ABDOMEN AND PELVIS WITH CONTRAST
TECHNIQUE: Multidetector CT imaging of the abdomen and pelvis was performed
using the standard protocol following bolus administration of
intravenous contrast.
CONTRAST:  100mL 6M6VH3-THH IOPAMIDOL (6M6VH3-THH) INJECTION 61%

[Series 2: abd pelvis 5.00 br40 s3 axial · axial · 0.50mm/px · z∈[+1040,+1335]mm · 9 of 75 slices shown, 11 images]
[im 8/75  soft-tissue]
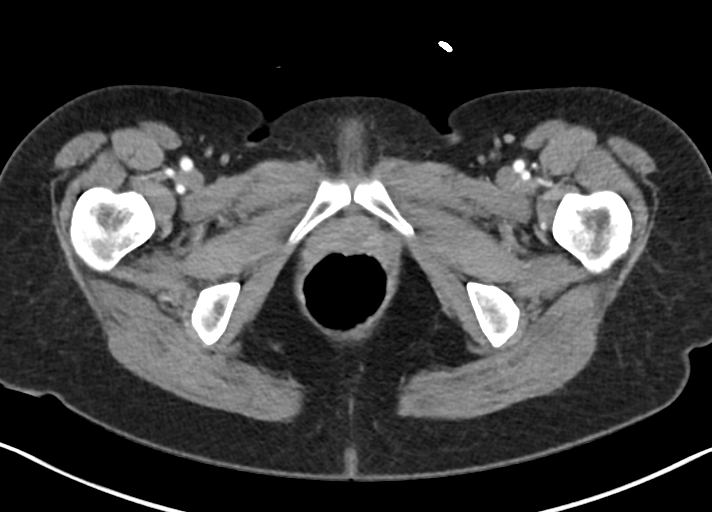
[im 8/75  bone]
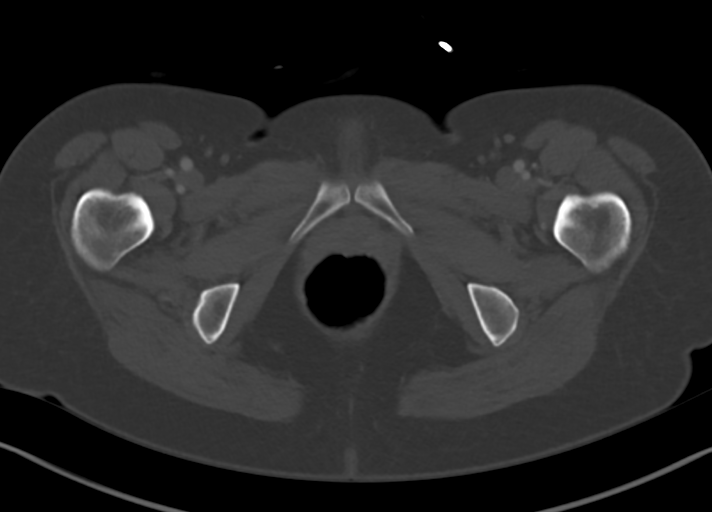
[im 15/75  soft-tissue]
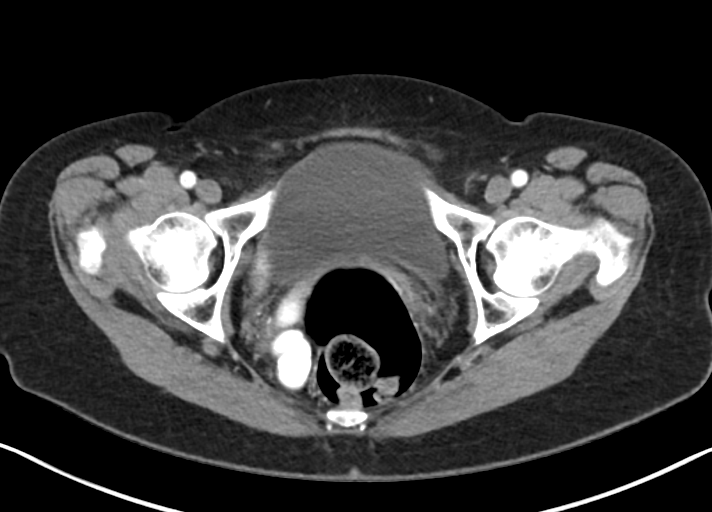
[im 23/75  soft-tissue]
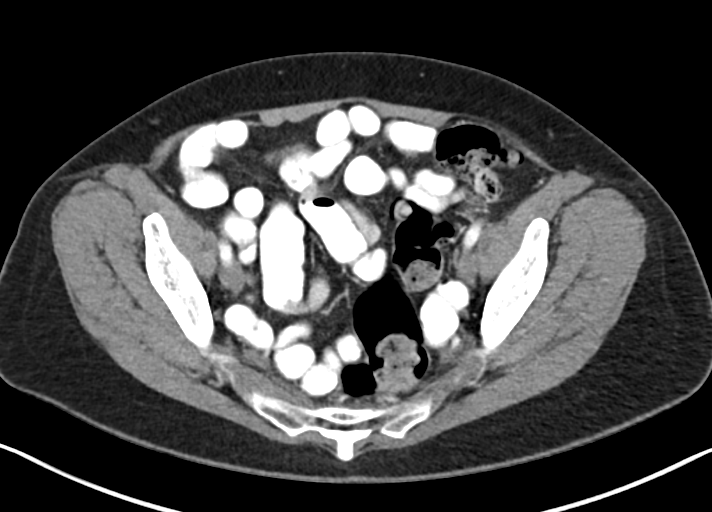
[im 30/75  soft-tissue]
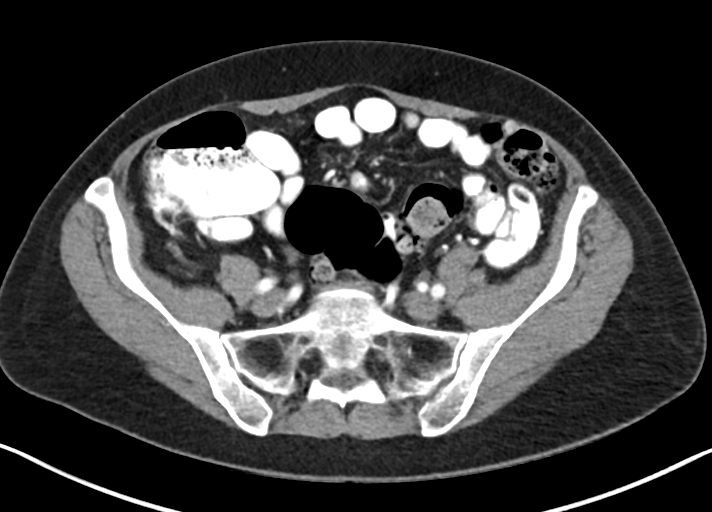
[im 38/75  soft-tissue]
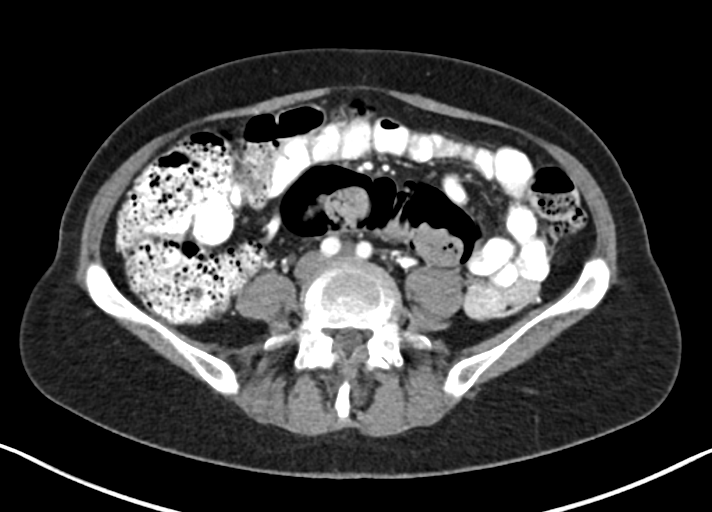
[im 45/75  soft-tissue]
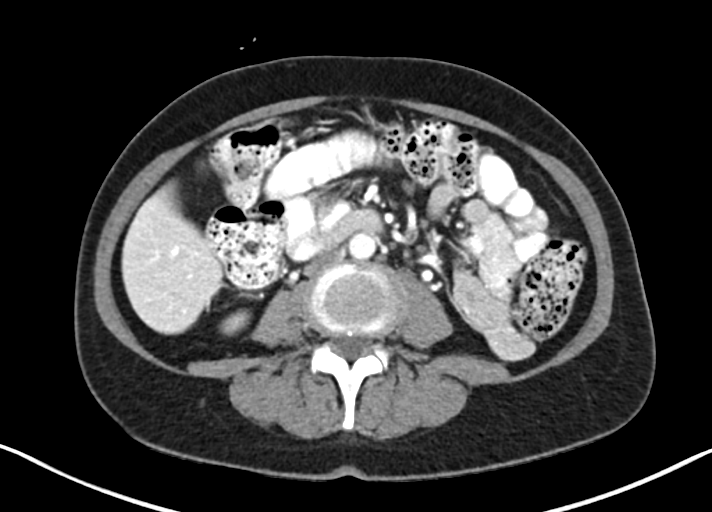
[im 52/75  soft-tissue]
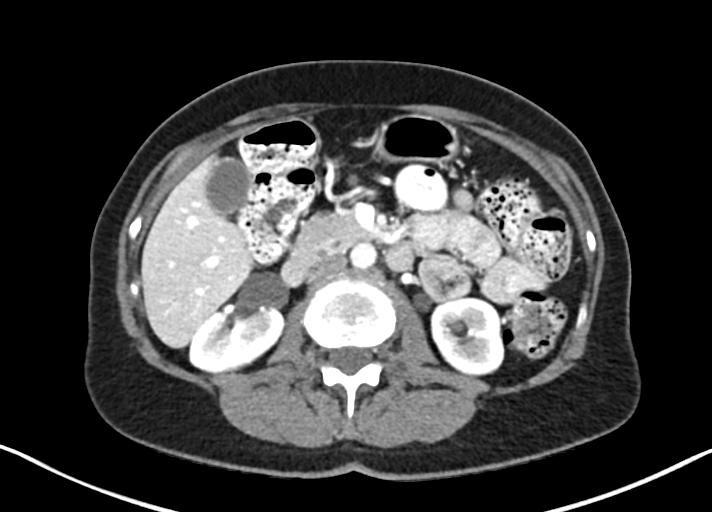
[im 60/75  soft-tissue]
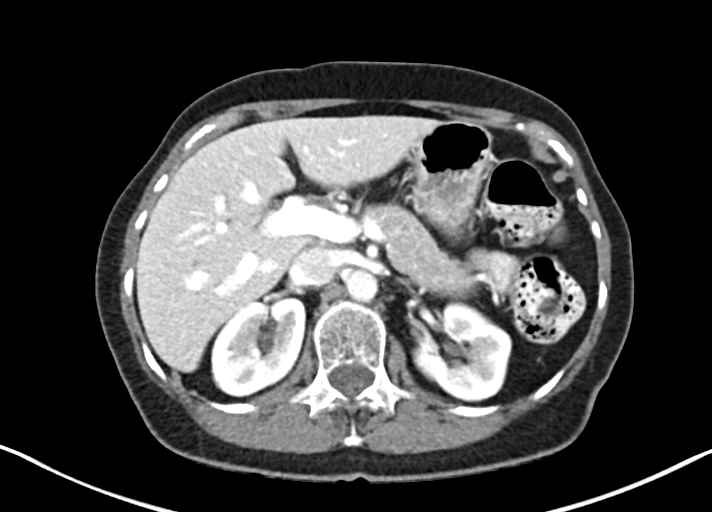
[im 67/75  soft-tissue]
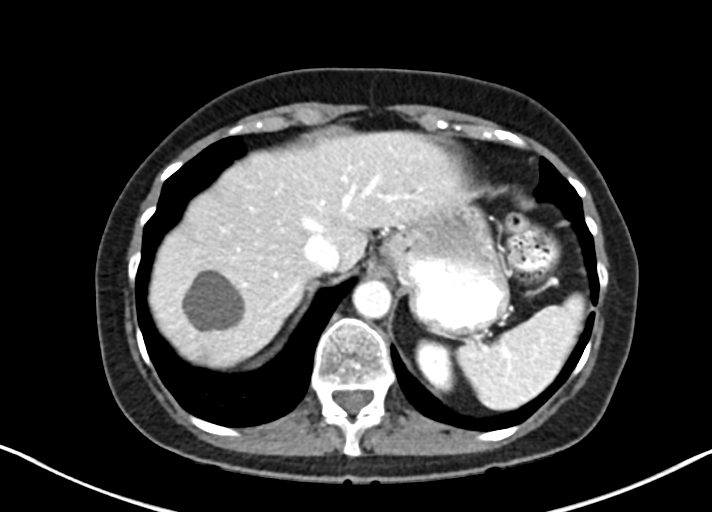
[im 67/75  bone]
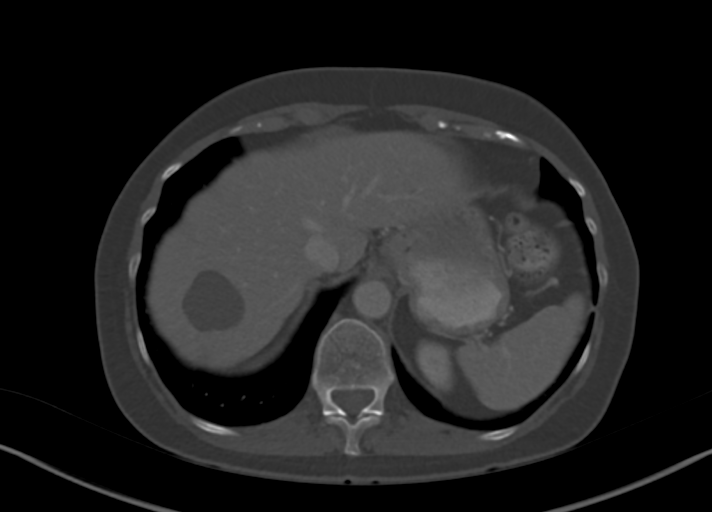

[Series 6: abd pelvis 2.00 br40 s3 cor · coronal · 0.69mm/px · 3 of 112 slices shown]
[im 38/112  soft-tissue]
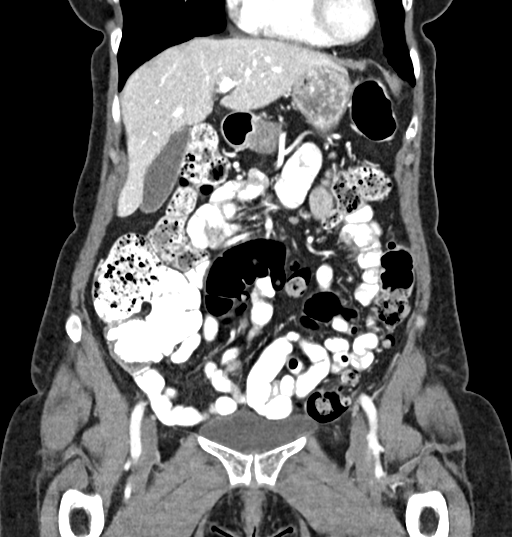
[im 50/112  soft-tissue]
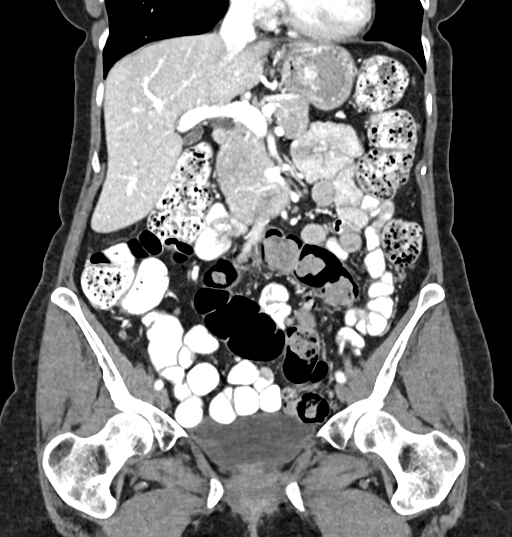
[im 62/112  soft-tissue]
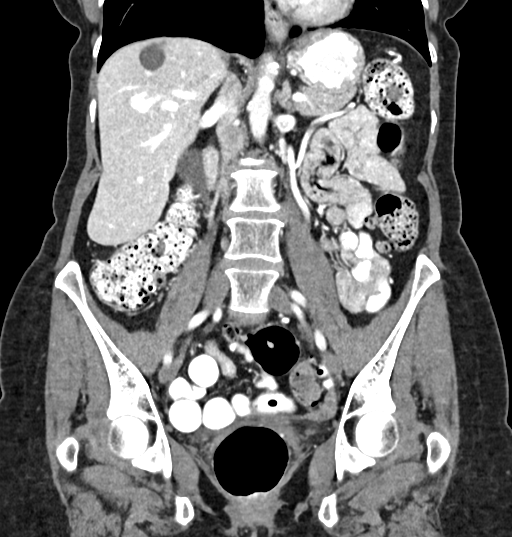

[12 of 46 positions shown; findings below may reference images not displayed]

FINDINGS: Lower chest: No acute abnormality.

Hepatobiliary: There is a dominant cyst in the right hepatic lobe
cords the dome measuring 3 cm. A few other tiny cysts are
identified, too small to completely characterize. Hepatic steatosis
is identified. The portal vein is patent. The gallbladder is normal.

Pancreas: Unremarkable. No pancreatic ductal dilatation or
surrounding inflammatory changes.

Spleen: Normal in size without focal abnormality.

Adrenals/Urinary Tract: Adrenal glands are unremarkable. Kidneys are
normal, without renal calculi, focal lesion, or hydronephrosis.
Bladder is unremarkable.

Stomach/Bowel: The stomach and small bowel are normal. Scattered
colonic diverticuli are identified, particularly in the sigmoid
colon and distal descending colon. There is moderate to severe fecal
loading throughout the length of the colon. The appendix is normal.

Vascular/Lymphatic: No significant vascular findings are present. No
enlarged abdominal or pelvic lymph nodes.

Reproductive: Status post hysterectomy. No adnexal masses.

Other: No abdominal wall hernia or abnormality. No abdominopelvic
ascites.

Musculoskeletal: No acute or significant osseous findings.
IMPRESSION: 1. Moderate to severe fecal loading throughout the colon.
2. Hepatic steatosis.
3. No other abnormalities.

## 2021-01-07 ENCOUNTER — Encounter: Payer: Self-pay | Admitting: Internal Medicine

## 2021-01-10 ENCOUNTER — Telehealth: Payer: Self-pay | Admitting: Internal Medicine

## 2021-01-10 NOTE — Telephone Encounter (Signed)
Dr Henrene Pastor just sent her a letter - she should get it this week and she can call the number on the letter directly to schedule

## 2021-01-10 NOTE — Telephone Encounter (Signed)
Spoke with patient today. 

## 2021-01-10 NOTE — Telephone Encounter (Signed)
Patient requesting referral to GI for screening colonoscopy

## 2021-04-07 ENCOUNTER — Other Ambulatory Visit: Payer: Self-pay

## 2021-04-07 ENCOUNTER — Ambulatory Visit (AMBULATORY_SURGERY_CENTER): Payer: Medicare Other

## 2021-04-07 VITALS — Ht 61.0 in | Wt 112.0 lb

## 2021-04-07 DIAGNOSIS — Z1211 Encounter for screening for malignant neoplasm of colon: Secondary | ICD-10-CM

## 2021-04-07 DIAGNOSIS — Z86010 Personal history of colonic polyps: Secondary | ICD-10-CM

## 2021-04-07 MED ORDER — SUTAB 1479-225-188 MG PO TABS: 1.0000 | ORAL_TABLET | ORAL | 0 refills | Status: DC

## 2021-04-07 NOTE — Progress Notes (Signed)
Pre visit completed via phone call;  Patient verified name, DOB, and address; No egg or soy allergy known to patient  No issues with past sedation with any surgeries or procedures---- patient is requesting light sedation-due to being a "light weight with anesthesia" Patient denies ever being told they had issues or difficulty with intubation  No FH of Malignant Hyperthermia No diet pills per patient No home 02 use per patient  No blood thinners per patient  Pt denies issues with constipation  No A fib or A flutter  EMMI video via MyChart  COVID 19 guidelines implemented in PV today with Pt and RN  Pt is fully vaccinated for Covid x 2;  Coupon given to pt in PV today, Code to Pharmacy and NO PA's for preps discussed with pt in PV today  Discussed with pt there will be an out-of-pocket cost for prep and that varies from $0 to 70 dollars   Due to the COVID-19 pandemic we are asking patients to follow certain guidelines.  Pt aware of COVID protocols and [facility] guidelines

## 2021-04-13 ENCOUNTER — Ambulatory Visit
Admission: EM | Admit: 2021-04-13 | Discharge: 2021-04-13 | Disposition: A | Discharge status: Home / Self Care | Payer: Medicare Other | Attending: Family Medicine | Admitting: Family Medicine

## 2021-04-13 ENCOUNTER — Ambulatory Visit (INDEPENDENT_AMBULATORY_CARE_PROVIDER_SITE_OTHER): Payer: Medicare Other

## 2021-04-13 ENCOUNTER — Encounter: Payer: Self-pay | Admitting: *Deleted

## 2021-04-13 ENCOUNTER — Other Ambulatory Visit: Payer: Self-pay

## 2021-04-13 DIAGNOSIS — G8929 Other chronic pain: Secondary | ICD-10-CM | POA: Diagnosis not present

## 2021-04-13 DIAGNOSIS — M542 Cervicalgia: Secondary | ICD-10-CM

## 2021-04-13 DIAGNOSIS — B370 Candidal stomatitis: Secondary | ICD-10-CM

## 2021-04-13 MED ORDER — FLUCONAZOLE 150 MG PO TABS: 150.0000 mg | ORAL_TABLET | Freq: Every day | ORAL | 0 refills | Status: DC

## 2021-04-13 MED ORDER — DICLOFENAC SODIUM 1 % EX GEL: 2.0000 g | Freq: Four times a day (QID) | CUTANEOUS | 2 refills | Status: DC

## 2021-04-13 NOTE — ED Triage Notes (Signed)
C/O transient tongue rash over past month; has seen dentist and uses Magic Mouthwash, which improves, but then returns.  Describes new lesions to tongue this AM. Also c/o ongoing neck pain "for months", wishes to have it evaluated.

## 2021-04-17 NOTE — ED Provider Notes (Signed)
[facility]    CSN: [id number] Arrival date & time: 04/13/21  1145      History   Chief Complaint Chief Complaint  Patient presents with   Tongue Rash   Neck Pain    HPI Robyn Hudson is a 65 y.o. female.   Presenting today with concerns over 1 month hx of white coating and lesions on tongue that started this morning. Minimally painful, no drainage, change in taste or dysphagia, fever, chills, new meds or supplements. Has been seen by dentist who gave magic mouthwash which did temporarily resolve sxs but they returned. Also having several months of b/l neck pain and stiffness. No known injury, radiation of pain or numbness/weakness down arms, headaches, fever, chills. Trying topical pain oils and magnets with no relief.    Past Medical History:  Diagnosis Date   Diverticulosis    Esophageal stricture    GERD (gastroesophageal reflux disease)    Hyperlipidemia    elevated due to very high HDL   Osteoporosis    Vitamin D deficiency     Patient Active Problem List   Diagnosis Date Noted   Dysuria 08/10/2020   Spider veins of both lower extremities 08/01/2016   Basal cell carcinoma of leg, right 07/26/2016   Esophageal reflux 09/28/2015   Varicose veins of both lower extremities 04/27/2014   Vitamin D deficiency 04/02/2013   Hyperlipidemia 10/04/2010   Osteoporosis 10/04/2010    Past Surgical History:  Procedure Laterality Date   ABDOMINAL HYSTERECTOMY     USO for dysfunctional menses & endometriosis   COLONOSCOPY  10/23/2010   [facility]--tics-movi(exc)-65yrrecall   TONSILLECTOMY     WISDOM TOOTH EXTRACTION      OB History   No obstetric history on file.      Home Medications    Prior to Admission medications   Medication Sig Start Date End Date Taking? Authorizing Provider  CALCIUM PO Take 1 tablet by mouth daily at 6 (six) AM.   Yes [provider]  diclofenac Sodium (VOLTAREN) 1 % GEL Apply 2 g topically 4 (four) times daily. 04/13/21   Yes LVolney American PA-C  fluconazole (DIFLUCAN) 150 MG tablet Take 1 tablet (150 mg total) by mouth daily. 04/13/21  Yes LVolney American PA-C  Sodium Sulfate-Mag Sulfate-KCl (SUTAB) [phone] TABS Take 1 kit by mouth as directed. 04/07/21  Yes PIrene Shipper MD  ibuprofen (ADVIL,MOTRIN) 600 MG tablet Take 600 mg by mouth every 6 (six) hours as needed.      [provider]    Family History Family History  Problem Relation Age of Onset   Dementia Mother        lewy body   Varicose Veins Brother    Peripheral vascular disease Brother    Lung cancer Maternal Grandfather        smoker   Heart failure Paternal Grandmother        in 730s  COPD Paternal Grandfather        smoker   Diabetes Neg Hx    Stroke Neg Hx    Colon polyps Neg Hx    Colon cancer Neg Hx    Esophageal cancer Neg Hx    Rectal cancer Neg Hx    Stomach cancer Neg Hx     Social History Social History   Tobacco Use   Smoking status: Never   Smokeless tobacco: Never  Vaping Use   Vaping Use: Never used  Substance Use Topics  Alcohol use: No   Drug use: No     Allergies   Sulfonamide derivatives   Review of Systems Review of Systems PER HPI    Physical Exam Triage Vital Signs ED Triage Vitals [04/13/21 1402]  Enc Vitals Group     BP 138/75     Pulse Rate 81     Resp 16     Temp 98.1 F (36.7 C)     Temp Source Temporal     SpO2 98 %     Weight      Height      Head Circumference      Peak Flow      Pain Score 5     Pain Loc      Pain Edu?      Excl. in [place]?    No data found.  Updated Vital Signs BP 138/75   Pulse 81   Temp 98.1 F (36.7 C) (Temporal)   Resp 16   SpO2 98%   Visual Acuity Right Eye Distance:   Left Eye Distance:   Bilateral Distance:    Right Eye Near:   Left Eye Near:    Bilateral Near:     Physical Exam Vitals and nursing note reviewed.  Constitutional:      Appearance: Normal appearance. She is not ill-appearing.   HENT:     Head: Atraumatic.     Nose: Nose normal.     Mouth/Throat:     Mouth: Mucous membranes are moist.     Comments: White coating across tongue with shallow ulcerations along lateral tongue distally Eyes:     Extraocular Movements: Extraocular movements intact.     Conjunctiva/sclera: Conjunctivae normal.  Cardiovascular:     Rate and Rhythm: Normal rate and regular rhythm.     Heart sounds: Normal heart sounds.  Pulmonary:     Effort: Pulmonary effort is normal.     Breath sounds: Normal breath sounds.  Musculoskeletal:        General: Tenderness (b/l cervical paraspinal ttp) present. No swelling, deformity or signs of injury. Normal range of motion.     Cervical back: Normal range of motion and neck supple.     Comments: Good ROM in all directions c spine No midline spinal ttp diffusely Strength full and equal b/l UEs  Skin:    General: Skin is warm and dry.  Neurological:     Mental Status: She is alert and oriented to person, place, and time.  Psychiatric:        Mood and Affect: Mood normal.        Thought Content: Thought content normal.        Judgment: Judgment normal.     UC Treatments / Results  Labs (all labs ordered are listed, but only abnormal results are displayed) Labs Reviewed - No data to display  EKG   Radiology No results found.  Procedures Procedures (including critical care time)  Medications Ordered in UC Medications - No data to display  Initial Impression / Assessment and Plan / UC Course  I have reviewed the triage vital signs and the nursing notes.  Pertinent labs & imaging results that were available during my care of the patient were reviewed by me and considered in my medical decision making (see chart for details).     Likely recurrence of thrush, oral diflucan sent and discussed probiotics, dental f/u if not resolving. C spine x-ray showing arthritis and some mild narrowing but no acute  abnormalities. She does not wish to  take oral medications so will send voltaren gel and continue supportive home care. Ortho follow up recommended if worsening.   Final Clinical Impressions(s) / UC Diagnoses   Final diagnoses:  Thrush  Chronic neck pain   Discharge Instructions   None    ED Prescriptions     Medication Sig Dispense Auth. Provider   diclofenac Sodium (VOLTAREN) 1 % GEL Apply 2 g topically 4 (four) times daily. 150 g Volney American, [place]   fluconazole (DIFLUCAN) 150 MG tablet Take 1 tablet (150 mg total) by mouth daily. 7 tablet Volney American, [place]      PDMP not reviewed this encounter.   Volney American, [place] 04/17/21 1025

## 2021-04-21 ENCOUNTER — Other Ambulatory Visit: Payer: Self-pay

## 2021-04-21 ENCOUNTER — Ambulatory Visit (AMBULATORY_SURGERY_CENTER): Payer: Medicare Other | Admitting: Internal Medicine

## 2021-04-21 ENCOUNTER — Encounter: Payer: Self-pay | Admitting: Internal Medicine

## 2021-04-21 VITALS — BP 144/80 | HR 76 | Temp 98.2°F | Resp 21 | Ht 61.0 in | Wt 112.0 lb

## 2021-04-21 DIAGNOSIS — Z1211 Encounter for screening for malignant neoplasm of colon: Secondary | ICD-10-CM

## 2021-04-21 MED ORDER — SODIUM CHLORIDE 0.9 % IV SOLN: 500.0000 mL | Freq: Once | INTRAVENOUS | Status: DC

## 2021-04-21 NOTE — Progress Notes (Signed)
Medical history reviewed with no changes noted since tele visit. VS assessed by C.W

## 2021-04-21 NOTE — Patient Instructions (Signed)
YOU HAD AN ENDOSCOPIC PROCEDURE TODAY AT THE [facility]:   Refer to the procedure report that was given to you for any specific questions about what was found during the examination.  If the procedure report does not answer your questions, please call your gastroenterologist to clarify.  If you requested that your care partner not be given the details of your procedure findings, then the procedure report has been included in a sealed envelope for you to review at your convenience later.  YOU SHOULD EXPECT: Some feelings of bloating in the abdomen. Passage of more gas than usual.  Walking can help get rid of the air that was put into your GI tract during the procedure and reduce the bloating. If you had a lower endoscopy (such as a colonoscopy or flexible sigmoidoscopy) you may notice spotting of blood in your stool or on the toilet paper. If you underwent a bowel prep for your procedure, you may not have a normal bowel movement for a few days.  Please Note:  You might notice some irritation and congestion in your nose or some drainage.  This is from the oxygen used during your procedure.  There is no need for concern and it should clear up in a day or so.  SYMPTOMS TO REPORT IMMEDIATELY:   Following lower endoscopy (colonoscopy or flexible sigmoidoscopy):  Excessive amounts of blood in the stool  Significant tenderness or worsening of abdominal pains  Swelling of the abdomen that is new, acute  Fever of 100F or higher   Following upper endoscopy (EGD)  Vomiting of blood or coffee ground material  New chest pain or pain under the shoulder blades  Painful or persistently difficult swallowing  New shortness of breath  Fever of 100F or higher  Black, tarry-looking stools  For urgent or emergent issues, a gastroenterologist can be reached at any hour by calling [phone]. Do not use MyChart messaging for urgent concerns.    DIET:  We do recommend a small meal at first, but  then you may proceed to your regular diet.  Drink plenty of fluids but you should avoid alcoholic beverages for 24 hours.  ACTIVITY:  You should plan to take it easy for the rest of today and you should NOT DRIVE or use heavy machinery until tomorrow (because of the sedation medicines used during the test).    FOLLOW UP: Our staff will call the number listed on your records 48-72 hours following your procedure to check on you and address any questions or concerns that you may have regarding the information given to you following your procedure. If we do not reach you, we will leave a message.  We will attempt to reach you two times.  During this call, we will ask if you have developed any symptoms of COVID 19. If you develop any symptoms (ie: fever, flu-like symptoms, shortness of breath, cough etc.) before then, please call [phone].  If you test positive for Covid 19 in the 2 weeks post procedure, please call and report this information to us.    If any biopsies were taken you will be contacted by phone or by letter within the next 1-3 weeks.  Please call us at [phone] if you have not heard about the biopsies in 3 weeks.    SIGNATURES/CONFIDENTIALITY: You and/or your care partner have signed paperwork which will be entered into your electronic medical record.  These signatures attest to the fact that that the information above on   your After Visit Summary has been reviewed and is understood.  Full responsibility of the confidentiality of this discharge information lies with you and/or your care-partner. 

## 2021-04-21 NOTE — Op Note (Signed)
[facility] Patient Name: Robyn Hudson Procedure Date: 04/21/2021 8:27 AM MRN: [id number] Endoscopist: Docia Chuck. Henrene Pastor , MD Age: 65 Referring MD:  Date of Birth: 1956/01/14 Gender: Female Account #: [id number] Procedure:                Colonoscopy Indications:              Screening for colorectal malignant neoplasm. prior                            exam 12-2010 Medicines:                Monitored Anesthesia Care Procedure:                Pre-Anesthesia Assessment:                           - Prior to the procedure, a History and Physical                            was performed, and patient medications and                            allergies were reviewed. The patient's tolerance of                            previous anesthesia was also reviewed. The risks                            and benefits of the procedure and the sedation                            options and risks were discussed with the patient.                            All questions were answered, and informed consent                            was obtained. Prior Anticoagulants: The patient has                            taken no previous anticoagulant or antiplatelet                            agents. ASA Grade Assessment: II - A patient with                            mild systemic disease. After reviewing the risks                            and benefits, the patient was deemed in                            satisfactory condition to undergo the procedure.  After obtaining informed consent, the colonoscope                            was passed under direct vision. Throughout the                            procedure, the patient's blood pressure, pulse, and                            oxygen saturations were monitored continuously. The                            Olympus CF-HQ190L [id number]) Colonoscope was                            introduced through the anus and advanced to the the                             cecum, identified by appendiceal orifice and                            ileocecal valve. The ileocecal valve, appendiceal                            orifice, and rectum were photographed. The quality                            of the bowel preparation was excellent. The                            colonoscopy was performed without difficulty. The                            patient tolerated the procedure well. The bowel                            preparation used was SUPREP via split dose                            instruction. Scope In: 8:48:23 AM Scope Out: 9:01:32 AM Scope Withdrawal Time: 0 hours 8 minutes 53 seconds  Total Procedure Duration: 0 hours 13 minutes 9 seconds  Findings:                 Multiple diverticula were found in the entire colon.                           An area of mild melanosis was found in the entire                            colon.                           The exam was otherwise without abnormality on  direct and retroflexion views. Complications:            No immediate complications. Estimated blood loss:                            None. Estimated Blood Loss:     Estimated blood loss: none. Impression:               - Diverticulosis in the entire examined colon.                           - Melanosis in the colon.                           - The examination was otherwise normal on direct                            and retroflexion views.                           - No specimens collected. Recommendation:           - Repeat colonoscopy is not recommended due to                            current age (25 years or older) for screening                            purposes.                           - Patient has a contact number available for                            emergencies. The signs and symptoms of potential                            delayed complications were discussed with the                             patient. Return to normal activities tomorrow.                            Written discharge instructions were provided to the                            patient.                           - Resume previous diet.                           - Continue present medications. Docia Chuck. Henrene Pastor, MD 04/21/2021 9:07:06 AM This report has been signed electronically.

## 2021-04-21 NOTE — Progress Notes (Signed)
PT taken to PACU. Monitors in place. VSS. Report given to RN. 

## 2021-04-26 ENCOUNTER — Telehealth: Payer: Self-pay

## 2021-04-26 NOTE — Telephone Encounter (Signed)
  Follow up Call-  Call back number 04/21/2021  Post procedure Call Back phone  # [phone]  Permission to leave phone message Yes  Some recent data might be hidden     Patient questions:  Do you have a fever, pain , or abdominal swelling? No. Pain Score  0 *  Have you tolerated food without any problems? Yes.    Have you been able to return to your normal activities? Yes.    Do you have any questions about your discharge instructions: Diet   No. Medications  No. Follow up visit  No.  Do you have questions or concerns about your Care? No.  Actions: * If pain score is 4 or above: No action needed, pain <4.  Have you developed a fever since your procedure? no  2.   Have you had an respiratory symptoms (SOB or cough) since your procedure? no  3.   Have you tested positive for COVID 19 since your procedure no  4.   Have you had any family members/close contacts diagnosed with the COVID 19 since your procedure?  no   If yes to any of these questions please route to Joylene John, RN and Joella Prince, RN

## 2021-05-20 ENCOUNTER — Telehealth: Payer: Self-pay | Admitting: Internal Medicine

## 2021-05-20 NOTE — Telephone Encounter (Signed)
The pt has complaints of constipation, bloating and gas.  She recently had a colon and has been advised that it can take a while for her bowels to return to normal. She will begin miralax daily and if her symptoms do not resolve she will call back.  She was advised that she can titrate the miralax as needed.  The pt has been advised of the information and verbalized understanding.

## 2021-05-20 NOTE — Telephone Encounter (Signed)
Patient called states she is having a lot of bloating\cramping\constipation said she has not been normal since her colonoscopy procedure. Seeking advise

## 2021-08-16 NOTE — Patient Instructions (Addendum)
    Blood work was ordered.      Medications changes include :   none     Please followup in 1 year

## 2021-08-16 NOTE — Progress Notes (Signed)
Subjective:    Patient ID: Robyn Hudson, female    DOB: 1956/03/04, 65 y.o.   MRN: [id number]   This visit occurred during the SARS-CoV-2 public health emergency.  Safety protocols were in place, including screening questions prior to the visit, additional usage of staff PPE, and extensive cleaning of exam room while observing appropriate contact time as indicated for disinfecting solutions.    HPI She is here for follow up of her chronic medical problems  She has no concerns.  She denies any changes in her health since she was here last.  Overall she feels well.    Medications and allergies reviewed with patient and updated if appropriate.  Patient Active Problem List   Diagnosis Date Noted   Spider veins of both lower extremities 08/01/2016   Basal cell carcinoma of leg, right 07/26/2016   Esophageal reflux 09/28/2015   Varicose veins of both lower extremities 04/27/2014   Vitamin D deficiency 04/02/2013   Hyperlipidemia 10/04/2010   Osteoporosis 10/04/2010    Current Outpatient Medications on File Prior to Visit  Medication Sig Dispense Refill   Ascorbic Acid (VITAMIN C) 1000 MG tablet Take 1,000 mg by mouth daily.     CALCIUM PO Take 1 tablet by mouth daily at 6 (six) AM.     ECHINACEA PO Take 900 mg by mouth daily.     Ginkgo Biloba 120 MG CAPS Take by mouth.     Glucosamine-Chondroit-Vit C-Mn (GLUCOSAMINE 1500 COMPLEX PO) Take 1,500 mg by mouth 3 (three) times daily.     ibuprofen (ADVIL,MOTRIN) 600 MG tablet Take 600 mg by mouth every 6 (six) hours as needed.       Multiple Vitamin (MULTIVITAMIN) tablet Take 1 tablet by mouth 2 (two) times daily.     Probiotic Product (PROBIOTIC DAILY PO) Take by mouth daily. Patient takes 25 billion CFU     Turmeric (QC TUMERIC COMPLEX PO) Take 2,250 mg by mouth 3 (three) times daily.     valACYclovir (VALTREX) 500 MG tablet Take 500 mg by mouth 2 (two) times daily.     VITAMIN D, CHOLECALCIFEROL, PO Take 250 mcg by mouth.      Zinc 30 MG CAPS Take by mouth.     No current facility-administered medications on file prior to visit.    Past Medical History:  Diagnosis Date   Diverticulosis    Esophageal stricture    GERD (gastroesophageal reflux disease)    Hyperlipidemia    elevated due to very high HDL   Osteoporosis    Vitamin D deficiency     Past Surgical History:  Procedure Laterality Date   ABDOMINAL HYSTERECTOMY     USO for dysfunctional menses & endometriosis   COLONOSCOPY  10/23/2010   JP--tics-movi(exc)-11yr recall   TONSILLECTOMY     WISDOM TOOTH EXTRACTION      Social History   Socioeconomic History   Marital status: Married    Spouse name: Not on file   Number of children: Not on file   Years of education: Not on file   Highest education level: Not on file  Occupational History   Not on file  Tobacco Use   Smoking status: Never   Smokeless tobacco: Never  Vaping Use   Vaping Use: Never used  Substance and Sexual Activity   Alcohol use: No   Drug use: No   Sexual activity: Not on file  Other Topics Concern   Not on file  Social History Narrative  Not on file   Social Determinants of Health   Financial Resource Strain: Not on file  Food Insecurity: Not on file  Transportation Needs: Not on file  Physical Activity: Not on file  Stress: Not on file  Social Connections: Not on file    Family History  Problem Relation Age of Onset   Dementia Mother        lewy body   Varicose Veins Brother    Peripheral vascular disease Brother    Lung cancer Maternal Grandfather        smoker   Heart failure Paternal Grandmother        in 29s   COPD Paternal Grandfather        smoker   Diabetes Neg Hx    Stroke Neg Hx    Colon polyps Neg Hx    Colon cancer Neg Hx    Esophageal cancer Neg Hx    Rectal cancer Neg Hx    Stomach cancer Neg Hx     Review of Systems  Constitutional:  Negative for chills and fever.  Eyes:  Negative for visual disturbance.  Respiratory:   Negative for cough, shortness of breath and wheezing.   Cardiovascular:  Negative for chest pain, palpitations and leg swelling.  Gastrointestinal:  Negative for abdominal pain, blood in stool, constipation, diarrhea and nausea.       No gerd  Genitourinary:  Negative for dysuria.  Musculoskeletal:  Negative for arthralgias and back pain.  Skin:  Negative for rash.  Neurological:  Negative for light-headedness and headaches.  Psychiatric/Behavioral:  Negative for dysphoric mood. The patient is not nervous/anxious.       Objective:   Vitals:   08/17/21 1120  BP: 116/78  Pulse: 76  Temp: 97.9 F (36.6 C)  SpO2: 99%   Filed Weights   08/17/21 1120  Weight: 113 lb (51.3 kg)   Body mass index is 21.35 kg/m.  BP Readings from Last 3 Encounters:  08/17/21 116/78  04/21/21 (!) 144/80  04/13/21 138/75    Wt Readings from Last 3 Encounters:  08/17/21 113 lb (51.3 kg)  04/21/21 112 lb (50.8 kg)  04/07/21 112 lb (50.8 kg)   Depression screen [facility] 2/9 08/17/2021 08/10/2020 06/10/2019 06/06/2018 06/05/2017  Decreased Interest 0 0 0 0 0  Down, Depressed, Hopeless 0 0 0 0 0  PHQ - 2 Score 0 0 0 0 0  Altered sleeping 0 - - - -  Tired, decreased energy 0 - - - -  Change in appetite 0 - - - -  Feeling bad or failure about yourself  0 - - - -  Trouble concentrating 0 - - - -  Moving slowly or fidgety/restless 0 - - - -  Suicidal thoughts 0 - - - -  PHQ-9 Score 0 - - - -    GAD 7 : Generalized Anxiety Score 08/17/2021  Nervous, Anxious, on Edge 0  Control/stop worrying 0  Worry too much - different things 0  Trouble relaxing 0  Restless 0  Easily annoyed or irritable 0  Afraid - awful might happen 0  Total GAD 7 Score 0       Physical Exam Constitutional: Appears well-developed and well-nourished. No distress.  Head: Normocephalic and atraumatic.  Neck: Neck supple. No tracheal deviation present. No thyromegaly present.  No cervical lymphadenopathy Cardiovascular: Normal  rate, regular rhythm and normal heart sounds.  No murmur heard. No carotid bruit .  No edema Pulmonary/Chest: Effort normal and  breath sounds normal. No respiratory distress. No has no wheezes. No rales.  Abdomen: soft, NT, ND Skin: Skin is warm and dry. Not diaphoretic.  Psychiatric: Normal mood and affect. Behavior is normal.     Lab Results  Component Value Date   WBC 7.2 08/10/2020   HGB 12.3 08/10/2020   HCT 36.6 08/10/2020   PLT 343.0 08/10/2020   GLUCOSE 81 08/10/2020   CHOL 202 (H) 08/10/2020   TRIG 86.0 08/10/2020   HDL 71.80 08/10/2020   LDLDIRECT 124.2 01/22/2013   LDLCALC 113 (H) 08/10/2020   ALT 15 08/10/2020   AST 21 08/10/2020   NA 140 08/10/2020   K 3.9 08/10/2020   CL 105 08/10/2020   CREATININE 0.57 08/10/2020   BUN 12 08/10/2020   CO2 28 08/10/2020   TSH 1.05 08/10/2020         Assessment & Plan:     Health Maintenance  Topic Date Due   COVID-19 Vaccine (3 - Pfizer risk series) 04/26/2020   DEXA SCAN  06/09/2021   MAMMOGRAM  06/30/2021   Zoster Vaccines- Shingrix (1 of 2) 11/17/2021 (Originally 01/14/1974)   INFLUENZA VACCINE  01/20/2022 (Originally 05/23/2021)   Pneumonia Vaccine 36+ Years old (1 - PCV) 08/17/2022 (Originally 01/14/1961)   TETANUS/TDAP  01/23/2023   COLONOSCOPY (Pts 45-66yrs Insurance coverage will need to be confirmed)  04/22/2031   Hepatitis C Screening  Completed   HPV VACCINES  Aged Out      Sees derm    See Problem List for Assessment and Plan of chronic medical problems.    I spent 20 minutes dedicated to the care of this patient on the date of this encounter including review of health maintenance, obtaining history, communicating with the patient,  ordering tests, and documenting clinical information in the EHR

## 2021-08-17 ENCOUNTER — Ambulatory Visit (INDEPENDENT_AMBULATORY_CARE_PROVIDER_SITE_OTHER): Payer: Medicare Other | Admitting: Internal Medicine

## 2021-08-17 ENCOUNTER — Telehealth: Payer: Self-pay

## 2021-08-17 ENCOUNTER — Other Ambulatory Visit: Payer: Self-pay

## 2021-08-17 ENCOUNTER — Encounter: Payer: Medicare Other | Admitting: Internal Medicine

## 2021-08-17 ENCOUNTER — Encounter: Payer: Self-pay | Admitting: Internal Medicine

## 2021-08-17 VITALS — BP 116/78 | HR 76 | Temp 97.9°F | Ht 61.0 in | Wt 113.0 lb

## 2021-08-17 DIAGNOSIS — Z85828 Personal history of other malignant neoplasm of skin: Secondary | ICD-10-CM

## 2021-08-17 DIAGNOSIS — E782 Mixed hyperlipidemia: Secondary | ICD-10-CM | POA: Diagnosis not present

## 2021-08-17 DIAGNOSIS — M810 Age-related osteoporosis without current pathological fracture: Secondary | ICD-10-CM

## 2021-08-17 DIAGNOSIS — E559 Vitamin D deficiency, unspecified: Secondary | ICD-10-CM

## 2021-08-17 DIAGNOSIS — Z1331 Encounter for screening for depression: Secondary | ICD-10-CM

## 2021-08-17 LAB — LIPID PANEL
Cholesterol: 229 mg/dL — ABNORMAL HIGH (ref 0–200)
HDL: 79.8 mg/dL (ref >39.00)
LDL Cholesterol: 138 mg/dL — ABNORMAL HIGH (ref 0–99)
NonHDL: 148.77
Total CHOL/HDL Ratio: 3
Triglycerides: 52 mg/dL (ref 0.0–149.0)
VLDL: 10.4 mg/dL (ref 0.0–40.0)

## 2021-08-17 LAB — TSH: TSH: 1.58 u[IU]/mL (ref 0.35–5.50)

## 2021-08-17 LAB — COMPREHENSIVE METABOLIC PANEL
ALT: 16 U/L (ref 0–35)
AST: 23 U/L (ref 0–37)
Albumin: 4.7 g/dL (ref 3.5–5.2)
Alkaline Phosphatase: 73 U/L (ref 39–117)
BUN: 10 mg/dL (ref 6–23)
CO2: 28 mEq/L (ref 19–32)
Calcium: 9.8 mg/dL (ref 8.4–10.5)
Chloride: 102 mEq/L (ref 96–112)
Creatinine, Ser: 0.57 mg/dL (ref 0.40–1.20)
GFR: 94.59 mL/min (ref >60.00)
Glucose, Bld: 81 mg/dL (ref 70–99)
Potassium: 3.9 mEq/L (ref 3.5–5.1)
Sodium: 140 mEq/L (ref 135–145)
Total Bilirubin: 0.6 mg/dL (ref 0.2–1.2)
Total Protein: 7.4 g/dL (ref 6.0–8.3)

## 2021-08-17 LAB — VITAMIN D 25 HYDROXY (VIT D DEFICIENCY, FRACTURES): VITD: 110.69 ng/mL (ref 30.00–100.00)

## 2021-08-17 LAB — HM MAMMOGRAPHY

## 2021-08-17 NOTE — Assessment & Plan Note (Signed)
Chronic Taking vitamin D daily Check vitamin D level  

## 2021-08-17 NOTE — Assessment & Plan Note (Signed)
Sees dermatology annually

## 2021-08-17 NOTE — Telephone Encounter (Signed)
Shanqunica from [facility] lab called with a critical value vitamin D at 110.69.

## 2021-08-17 NOTE — Telephone Encounter (Signed)
Message sent to patient via [facility].  She is currently taking 10,000 units of vitamin D daily-advised her to take 10,000 units twice daily.  We can recheck her vitamin D level in about 4 months

## 2021-08-17 NOTE — Assessment & Plan Note (Signed)
Chronic Continue regular exercise and healthy diet Check lipid panel, CMP Diet controlled

## 2021-08-17 NOTE — Assessment & Plan Note (Signed)
Chronic DEXA due-ordered She is walking regularly She will start doing some light weights Taking calcium and vitamin D daily-currently taking 10,000 units of calcium daily, which may be too much she We will check vitamin D level, CMP, TSH

## 2021-08-18 LAB — HM DEXA SCAN

## 2021-08-25 ENCOUNTER — Encounter: Payer: Self-pay | Admitting: Internal Medicine

## 2021-08-25 NOTE — Progress Notes (Signed)
Outside notes received. Information abstracted. Notes sent to scan.  

## 2021-09-06 ENCOUNTER — Encounter: Payer: Self-pay | Admitting: Internal Medicine

## 2021-09-07 NOTE — Telephone Encounter (Signed)
Patient called back to check on status of results. Relayed we had not received the results yet but would be contacting [facility] to have them fax over. She stated on her paperwork her BMI was listed over 35. She thought was high due to her current weight and height.

## 2021-09-08 ENCOUNTER — Encounter: Payer: Self-pay | Admitting: Internal Medicine

## 2021-12-05 ENCOUNTER — Encounter: Payer: Self-pay | Admitting: Internal Medicine

## 2021-12-05 NOTE — Progress Notes (Signed)
Outside notes received. Information abstracted. Notes sent to scan.  

## 2021-12-12 ENCOUNTER — Other Ambulatory Visit: Payer: Medicare Other

## 2021-12-12 ENCOUNTER — Other Ambulatory Visit: Payer: Self-pay

## 2021-12-12 ENCOUNTER — Telehealth: Payer: Self-pay

## 2021-12-12 DIAGNOSIS — E559 Vitamin D deficiency, unspecified: Secondary | ICD-10-CM

## 2021-12-12 LAB — VITAMIN D 25 HYDROXY (VIT D DEFICIENCY, FRACTURES): VITD: 81.53 ng/mL (ref 30.00–100.00)

## 2021-12-12 NOTE — Telephone Encounter (Signed)
Pt was told via [facility] in Oct. 2022 that she will need to recheck vitamin D lab. Pt made an appt for today at 2:45 but I didn't notice the order in there.

## 2021-12-12 NOTE — Telephone Encounter (Signed)
Lab ordered.

## 2022-01-15 ENCOUNTER — Other Ambulatory Visit: Payer: Self-pay

## 2022-01-15 ENCOUNTER — Ambulatory Visit
Admission: EM | Admit: 2022-01-15 | Discharge: 2022-01-15 | Disposition: A | Discharge status: Home / Self Care | Payer: Medicare Other | Attending: Emergency Medicine | Admitting: Emergency Medicine

## 2022-01-15 DIAGNOSIS — R300 Dysuria: Secondary | ICD-10-CM | POA: Diagnosis present

## 2022-01-15 LAB — POCT URINALYSIS DIP (MANUAL ENTRY)
Bilirubin, UA: NEGATIVE
Glucose, UA: NEGATIVE mg/dL
Ketones, POC UA: NEGATIVE mg/dL
Nitrite, UA: NEGATIVE
Protein Ur, POC: 100 mg/dL — AB
Spec Grav, UA: 1.015 (ref 1.010–1.025)
Urobilinogen, UA: 0.2 E.U./dL
pH, UA: 5.5 (ref 5.0–8.0)

## 2022-01-15 MED ORDER — FOSFOMYCIN TROMETHAMINE 3 G PO PACK: 3.0000 g | PACK | Freq: Once | ORAL | 0 refills | Status: AC

## 2022-01-15 NOTE — ED Provider Notes (Signed)
?[facility] ? ? ? ?CSN: [id number] ?Arrival date & time: 01/15/22  8453 ?  ? ?HISTORY  ? ?Chief Complaint  ?Patient presents with  ? Urinary Tract Infection  ? ?HPI ?Robyn Hudson is a 66 y.o. female. Pt c/o dysuria, possible hematuria, urgency.  Denies frequency, pelvic pain, new lower back pain, vaginal discharge or malodors.  Onset > 1 week ago. States when his given macrobid from PCP without any and is 3.5 days into 7 days tx but states medication causes too much stomach upset to complete.  Patient states after 3 days, she had relief of all of her symptoms however this morning woke up with severe burning with urination and urgency.  Allergy list updated to reflect sulfa allergy. ? ?The history is provided by the patient.  ?Past Medical History:  ?Diagnosis Date  ? Diverticulosis   ? Esophageal stricture   ? GERD (gastroesophageal reflux disease)   ? Hyperlipidemia   ? elevated due to very high HDL  ? Osteoporosis   ? Vitamin D deficiency   ? ?Patient Active Problem List  ? Diagnosis Date Noted  ? Spider veins of both lower extremities 08/01/2016  ? History of basal cell carcinoma (BCC) of skin 07/26/2016  ? Esophageal reflux 09/28/2015  ? Varicose veins of both lower extremities 04/27/2014  ? Vitamin D deficiency 04/02/2013  ? Hyperlipidemia 10/04/2010  ? Osteoporosis 10/04/2010  ? ?Past Surgical History:  ?Procedure Laterality Date  ? ABDOMINAL HYSTERECTOMY    ? USO for dysfunctional menses & endometriosis  ? COLONOSCOPY  10/23/2010  ? JP--tics-movi(exc)-14yrrecall  ? TONSILLECTOMY    ? WISDOM TOOTH EXTRACTION    ? ?OB History   ?No obstetric history on file. ?  ? ?Home Medications   ? ?Prior to Admission medications   ?Medication Sig Start Date End Date Taking? Authorizing Provider  ?Ascorbic Acid (VITAMIN C) 1000 MG tablet Take 1,000 mg by mouth daily.    [provider]  ?CALCIUM PO Take 1 tablet by mouth daily at 6 (six) AM.    [provider]  ?ECHINACEA PO Take 900 mg  by mouth daily.    [provider]  ?Ginkgo Biloba 120 MG CAPS Take by mouth.    [provider]  ?Glucosamine-Chondroit-Vit C-Mn (GLUCOSAMINE 1500 COMPLEX PO) Take 1,500 mg by mouth 3 (three) times daily.    [provider]  ?ibuprofen (ADVIL,MOTRIN) 600 MG tablet Take 600 mg by mouth every 6 (six) hours as needed.      [provider]  ?Multiple Vitamin (MULTIVITAMIN) tablet Take 1 tablet by mouth 2 (two) times daily.    [provider]  ?Probiotic Product (PROBIOTIC DAILY PO) Take by mouth daily. Patient takes 25 billion CFU    [provider]  ?Turmeric (QC TUMERIC COMPLEX PO) Take 2,250 mg by mouth 3 (three) times daily.    [provider]  ?valACYclovir (VALTREX) 500 MG tablet Take 500 mg by mouth 2 (two) times daily. 06/30/21   [provider]  ?VITAMIN D, CHOLECALCIFEROL, PO Take 250 mcg by mouth.    [provider]  ?Zinc 30 MG CAPS Take by mouth.    [provider]  ? ?Family History ?Family History  ?Problem Relation Age of Onset  ? Dementia Mother   ?     lewy body  ? Varicose Veins Brother   ? Peripheral vascular disease Brother   ? Lung cancer Maternal Grandfather   ?  smoker  ? Heart failure Paternal Grandmother   ?     in 45s  ? COPD Paternal Grandfather   ?     smoker  ? Diabetes Neg Hx   ? Stroke Neg Hx   ? Colon polyps Neg Hx   ? Colon cancer Neg Hx   ? Esophageal cancer Neg Hx   ? Rectal cancer Neg Hx   ? Stomach cancer Neg Hx   ? ?Social History ?Social History  ? ?Tobacco Use  ? Smoking status: Never  ? Smokeless tobacco: Never  ?Vaping Use  ? Vaping Use: Never used  ?Substance Use Topics  ? Alcohol use: No  ? Drug use: No  ? ?Allergies   ?Sulfonamide derivatives ? ?Review of Systems ?Review of Systems ?Pertinent findings noted in history of present illness.  ? ?Physical Exam ?Triage Vital Signs ?ED Triage Vitals  ?Enc Vitals Group  ?   BP 08/19/21 0827 (!) 147/82  ?   Pulse Rate 08/19/21 0827 72  ?    Resp 08/19/21 0827 18  ?   Temp 08/19/21 0827 98.3 ?F (36.8 ?C)  ?   Temp Source 08/19/21 0827 Oral  ?   SpO2 08/19/21 0827 98 %  ?   Weight --   ?   Height --   ?   Head Circumference --   ?   Peak Flow --   ?   Pain Score 08/19/21 0826 5  ?   Pain Loc --   ?   Pain Edu? --   ?   Excl. in Blue? --   ?No data found. ? ?Updated Vital Signs ?BP 118/75 (BP Location: Left Arm)   Pulse 84   Temp 97.7 ?F (36.5 ?C) (Oral)   Resp 18   SpO2 98%  ? ?Physical Exam ?Vitals and nursing note reviewed.  ?Constitutional:   ?   General: She is not in acute distress. ?   Appearance: Normal appearance. She is not ill-appearing.  ?HENT:  ?   Head: Normocephalic and atraumatic.  ?Eyes:  ?   General: Lids are normal.     ?   Right eye: No discharge.     ?   Left eye: No discharge.  ?   Extraocular Movements: Extraocular movements intact.  ?   Conjunctiva/sclera: Conjunctivae normal.  ?   Right eye: Right conjunctiva is not injected.  ?   Left eye: Left conjunctiva is not injected.  ?Neck:  ?   Trachea: Trachea and phonation normal.  ?Cardiovascular:  ?   Rate and Rhythm: Normal rate and regular rhythm.  ?   Pulses: Normal pulses.  ?   Heart sounds: Normal heart sounds. No murmur heard. ?  No friction rub. No gallop.  ?Pulmonary:  ?   Effort: Pulmonary effort is normal. No accessory muscle usage, prolonged expiration or respiratory distress.  ?   Breath sounds: Normal breath sounds. No stridor, decreased air movement or transmitted upper airway sounds. No decreased breath sounds, wheezing, rhonchi or rales.  ?Chest:  ?   Chest wall: No tenderness.  ?Abdominal:  ?   General: Abdomen is flat. Bowel sounds are normal. There is no distension.  ?   Palpations: Abdomen is soft.  ?   Tenderness: There is abdominal tenderness in the suprapubic area. There is no right CVA tenderness or left CVA tenderness.  ?   Hernia: No hernia is present.  ?Musculoskeletal:     ?   General: Normal range  of motion.  ?   Cervical back: Normal range of motion and  neck supple. Normal range of motion.  ?Lymphadenopathy:  ?   Cervical: No cervical adenopathy.  ?Skin: ?   General: Skin is warm and dry.  ?   Findings: No erythema or rash.  ?Neurological:  ?   General: No focal deficit present.  ?   Mental Status: She is alert and oriented to person, place, and time.  ?Psychiatric:     ?   Mood and Affect: Mood normal.     ?   Behavior: Behavior normal.  ? ? ?Visual Acuity ?Right Eye Distance:   ?Left Eye Distance:   ?Bilateral Distance:   ? ?Right Eye Near:   ?Left Eye Near:    ?Bilateral Near:    ? ?UC Couse / Diagnostics / Procedures:  ?  ?EKG ? ?Radiology ?No results found. ? ?Procedures ?Procedures (including critical care time) ? ?UC Diagnoses / Final Clinical Impressions(s)   ?I have reviewed the triage vital signs and the nursing notes. ? ?Pertinent labs & imaging results that were available during my care of the patient were reviewed by me and considered in my medical decision making (see chart for details).   ? ?Final diagnoses:  ?Dysuria  ? ?Patient provided with prescription for fosfomycin while urine culture is in process.  Patient advised we will adjust antibiotic therapy as needed based on results of urine culture.  Patient requested printed prescriptions that she could go have it filled now.  Return precautions advised. ? ?ED Prescriptions   ? ? Medication Sig Dispense Auth. Provider  ? fosfomycin (MONUROL) 3 g PACK Take 3 g by mouth once for 1 dose. 3 g Lynden Oxford Scales, PA-C  ? ?  ? ?PDMP not reviewed this encounter. ? ?Pending results:  ?Labs Reviewed  ?POCT URINALYSIS DIP (MANUAL ENTRY) - Abnormal; Notable for the following components:  ?    Result Value  ? Clarity, UA cloudy (*)   ? Blood, UA large (*)   ? Protein Ur, POC =100 (*)   ? Leukocytes, UA Small (1+) (*)   ? All other components within normal limits  ?URINE CULTURE  ? ? ?Medications Ordered in UC: ?Medications - No data to display ? ?Disposition Upon Discharge:  ?Condition: stable for discharge  home ? ?Patient presented with concern for an acute illness with associated systemic symptoms and significant discomfort requiring urgent management. In my opinion, this is a condition that a prudent lay

## 2022-01-15 NOTE — Discharge Instructions (Addendum)
The urinalysis that we performed in the clinic today was abnormal.   ?  ?You were advised to begin antibiotics today because you are having active symptoms of a urinary tract infection.   ? ?Urine culture will be performed per our protocol.  The result of the urine culture will be available in the next 3 to 5 days and will be posted to your MyChart account.  If there is an abnormal finding, you will be contacted by phone and advised of further treatment recommendations, if any. ? ?If you have not had complete resolution of your symptoms after completing treatment as prescribed, please return to urgent care for repeat evaluation or follow-up with your primary care provider. ?  ?Thank you for visiting urgent care today.  I appreciate the opportunity to participate in your care. ? ? ?

## 2022-01-15 NOTE — ED Triage Notes (Signed)
Pt c/o dysuria, possible hematuria, urgency,  ? ?Denies frequency, pelvic pain, new lower back pain, vaginal discharge or malodors. ? ?Onset > 1 week ago. States given macrobid from PCP and is 3.5 days in 7 days tx but medication causes too much stomach upset to complete.  ? ?States broke out into rash with Sulfa medication several years ago.  ?

## 2022-01-17 LAB — URINE CULTURE: Culture: 20000 — AB

## 2022-02-02 ENCOUNTER — Ambulatory Visit (INDEPENDENT_AMBULATORY_CARE_PROVIDER_SITE_OTHER): Payer: Medicare Other

## 2022-02-02 DIAGNOSIS — Z0000 Encounter for general adult medical examination without abnormal findings: Secondary | ICD-10-CM

## 2022-02-02 NOTE — Patient Instructions (Signed)
Robyn Hudson , ?Thank you for taking time to come for your Medicare Wellness Visit. I appreciate your ongoing commitment to your health goals. Please review the following plan we discussed and let me know if I can assist you in the future.  ? ?Screening recommendations/referrals: ?Colonoscopy: 04/21/2021; due every 10 years ?Mammogram: 08/17/2021; due every year ?Bone Density: 08/18/2021; due every 2 years ?Recommended yearly ophthalmology/optometry visit for glaucoma screening and checkup ?Recommended yearly dental visit for hygiene and checkup ? ?Vaccinations: ?Influenza vaccine: declined ?Pneumococcal vaccine: declined ?Tdap vaccine: 01/22/2013; due every 10 years ?Shingles vaccine: declined   ?Covid-19: 03/04/2020, 03/29/2020 ? ?Advanced directives: Yes; Please bring a copy of your health care power of attorney and living will to the office at your convenience. ? ?Conditions/risks identified: Yes; My goal is to maintain my current health status by continuing to eat healthy, stay physically active and socially active. ? ?Next appointment: Please schedule your next Medicare Wellness Visit with your Nurse Health Advisor in 1 year by calling [phone]. ? ? ?Preventive Care 67 Years and Older, Female ?Preventive care refers to lifestyle choices and visits with your health care provider that can promote health and wellness. ?What does preventive care include? ?A yearly physical exam. This is also called an annual well check. ?Dental exams once or twice a year. ?Routine eye exams. Ask your health care provider how often you should have your eyes checked. ?Personal lifestyle choices, including: ?Daily care of your teeth and gums. ?Regular physical activity. ?Eating a healthy diet. ?Avoiding tobacco and drug use. ?Limiting alcohol use. ?Practicing safe sex. ?Taking low-dose aspirin every day. ?Taking vitamin and mineral supplements as recommended by your health care provider. ?What happens during an annual well check? ?The  services and screenings done by your health care provider during your annual well check will depend on your age, overall health, lifestyle risk factors, and family history of disease. ?Counseling  ?Your health care provider may ask you questions about your: ?Alcohol use. ?Tobacco use. ?Drug use. ?Emotional well-being. ?Home and relationship well-being. ?Sexual activity. ?Eating habits. ?History of falls. ?Memory and ability to understand (cognition). ?Work and work Statistician. ?Reproductive health. ?Screening  ?You may have the following tests or measurements: ?Height, weight, and BMI. ?Blood pressure. ?Lipid and cholesterol levels. These may be checked every 5 years, or more frequently if you are over 84 years old. ?Skin check. ?Lung cancer screening. You may have this screening every year starting at age 66 if you have a 30-pack-year history of smoking and currently smoke or have quit within the past 15 years. ?Fecal occult blood test (FOBT) of the stool. You may have this test every year starting at age 38. ?Flexible sigmoidoscopy or colonoscopy. You may have a sigmoidoscopy every 5 years or a colonoscopy every 10 years starting at age 4. ?Hepatitis C blood test. ?Hepatitis B blood test. ?Sexually transmitted disease (STD) testing. ?Diabetes screening. This is done by checking your blood sugar (glucose) after you have not eaten for a while (fasting). You may have this done every 1-3 years. ?Bone density scan. This is done to screen for osteoporosis. You may have this done starting at age 17. ?Mammogram. This may be done every 1-2 years. Talk to your health care provider about how often you should have regular mammograms. ?Talk with your health care provider about your test results, treatment options, and if necessary, the need for more tests. ?Vaccines  ?Your health care provider may recommend certain vaccines, such as: ?Influenza vaccine. This is  recommended every year. ?Tetanus, diphtheria, and acellular  pertussis (Tdap, Td) vaccine. You may need a Td booster every 10 years. ?Zoster vaccine. You may need this after age 21. ?Pneumococcal 13-valent conjugate (PCV13) vaccine. One dose is recommended after age 64. ?Pneumococcal polysaccharide (PPSV23) vaccine. One dose is recommended after age 74. ?Talk to your health care provider about which screenings and vaccines you need and how often you need them. ?This information is not intended to replace advice given to you by your health care provider. Make sure you discuss any questions you have with your health care provider. ?Document Released: 11/05/2015 Document Revised: 06/28/2016 Document Reviewed: 08/10/2015 ?Elsevier Interactive Patient Education ? 2017 [place]. ? ?Fall Prevention in the Home ?Falls can cause injuries. They can happen to people of all ages. There are many things you can do to make your home safe and to help prevent falls. ?What can I do on the outside of my home? ?Regularly fix the edges of walkways and driveways and fix any cracks. ?Remove anything that might make you trip as you walk through a door, such as a raised step or threshold. ?Trim any bushes or trees on the path to your home. ?Use bright outdoor lighting. ?Clear any walking paths of anything that might make someone trip, such as rocks or tools. ?Regularly check to see if handrails are loose or broken. Make sure that both sides of any steps have handrails. ?Any raised decks and porches should have guardrails on the edges. ?Have any leaves, snow, or ice cleared regularly. ?Use sand or salt on walking paths during winter. ?Clean up any spills in your garage right away. This includes oil or grease spills. ?What can I do in the bathroom? ?Use night lights. ?Install grab bars by the toilet and in the tub and shower. Do not use towel bars as grab bars. ?Use non-skid mats or decals in the tub or shower. ?If you need to sit down in the shower, use a plastic, non-slip stool. ?Keep the floor  dry. Clean up any water that spills on the floor as soon as it happens. ?Remove soap buildup in the tub or shower regularly. ?Attach bath mats securely with double-sided non-slip rug tape. ?Do not have throw rugs and other things on the floor that can make you trip. ?What can I do in the bedroom? ?Use night lights. ?Make sure that you have a light by your bed that is easy to reach. ?Do not use any sheets or blankets that are too big for your bed. They should not hang down onto the floor. ?Have a firm chair that has side arms. You can use this for support while you get dressed. ?Do not have throw rugs and other things on the floor that can make you trip. ?What can I do in the kitchen? ?Clean up any spills right away. ?Avoid walking on wet floors. ?Keep items that you use a lot in easy-to-reach places. ?If you need to reach something above you, use a strong step stool that has a grab bar. ?Keep electrical cords out of the way. ?Do not use floor polish or wax that makes floors slippery. If you must use wax, use non-skid floor wax. ?Do not have throw rugs and other things on the floor that can make you trip. ?What can I do with my stairs? ?Do not leave any items on the stairs. ?Make sure that there are handrails on both sides of the stairs and use them. Fix handrails that are  broken or loose. Make sure that handrails are as long as the stairways. ?Check any carpeting to make sure that it is firmly attached to the stairs. Fix any carpet that is loose or worn. ?Avoid having throw rugs at the top or bottom of the stairs. If you do have throw rugs, attach them to the floor with carpet tape. ?Make sure that you have a light switch at the top of the stairs and the bottom of the stairs. If you do not have them, ask someone to add them for you. ?What else can I do to help prevent falls? ?Wear shoes that: ?Do not have high heels. ?Have rubber bottoms. ?Are comfortable and fit you well. ?Are closed at the toe. Do not wear  sandals. ?If you use a stepladder: ?Make sure that it is fully opened. Do not climb a closed stepladder. ?Make sure that both sides of the stepladder are locked into place. ?Ask someone to hold it for you, if possi

## 2022-02-02 NOTE — Progress Notes (Signed)
?I connected with Robyn Hudson today by telephone and verified that I am speaking with the correct person using two identifiers. ?Location patient: home ?Location provider: work ?Persons participating in the virtual visit: patient, provider. ?  ?I discussed the limitations, risks, security and privacy concerns of performing an evaluation and management service by telephone and the availability of in person appointments. I also discussed with the patient that there may be a patient responsible charge related to this service. The patient expressed understanding and verbally consented to this telephonic visit.  ?  ?Interactive audio and video telecommunications were attempted between this provider and patient, however failed, due to patient having technical difficulties OR patient did not have access to video capability.  We continued and completed visit with audio only. ? ?Some vital signs may be absent or patient reported.  ? ?Time Spent with patient on telephone encounter: 30 minutes ? ?Subjective:  ? Robyn Hudson is a 66 y.o. female who presents for an Initial Medicare Annual Wellness Visit. ? ?Review of Systems    ? ?Cardiac Risk Factors include: advanced age (>28mn, >>16women);family history of premature cardiovascular disease ? ?   ?Objective:  ?  ?Today's Vitals  ? 02/02/22 0923  ?PainSc: 0-No pain  ? ?There is no height or weight on file to calculate BMI. ? ? ?  02/02/2022  ?  9:26 AM 08/01/2016  ?  3:12 PM  ?Advanced Directives  ?Does Patient Have a Medical Advance Directive? Yes Yes  ?Type of Advance Directive Living will;Healthcare Power of Attorney Living will;Healthcare Power of Attorney  ?Does patient want to make changes to medical advance directive? No - Patient declined No - Patient declined  ?Copy of [place]? No - copy requested No - copy requested  ? ? ?Current Medications (verified) ?Outpatient Encounter Medications as of 02/02/2022  ?Medication Sig  ? Ascorbic  Acid (VITAMIN C) 1000 MG tablet Take 1,000 mg by mouth daily.  ? CALCIUM PO Take 1 tablet by mouth daily at 6 (six) AM.  ? ECHINACEA PO Take 900 mg by mouth daily.  ? Ginkgo Biloba 120 MG CAPS Take by mouth.  ? Glucosamine-Chondroit-Vit C-Mn (GLUCOSAMINE 1500 COMPLEX PO) Take 1,500 mg by mouth 3 (three) times daily.  ? ibuprofen (ADVIL,MOTRIN) 600 MG tablet Take 600 mg by mouth every 6 (six) hours as needed.    ? Multiple Vitamin (MULTIVITAMIN) tablet Take 1 tablet by mouth 2 (two) times daily.  ? Probiotic Product (PROBIOTIC DAILY PO) Take by mouth daily. Patient takes 25 billion CFU  ? Turmeric (QC TUMERIC COMPLEX PO) Take 2,250 mg by mouth 3 (three) times daily.  ? valACYclovir (VALTREX) 500 MG tablet Take 500 mg by mouth 2 (two) times daily.  ? VITAMIN D, CHOLECALCIFEROL, PO Take 250 mcg by mouth.  ? Zinc 30 MG CAPS Take by mouth.  ? ?No facility-administered encounter medications on file as of 02/02/2022.  ? ? ?Allergies (verified) ?Sulfonamide derivatives  ? ?History: ?Past Medical History:  ?Diagnosis Date  ? Diverticulosis   ? Esophageal stricture   ? GERD (gastroesophageal reflux disease)   ? Hyperlipidemia   ? elevated due to very high HDL  ? Osteoporosis   ? Vitamin D deficiency   ? ?Past Surgical History:  ?Procedure Laterality Date  ? ABDOMINAL HYSTERECTOMY    ? USO for dysfunctional menses & endometriosis  ? COLONOSCOPY  10/23/2010  ? JP--tics-movi(exc)-167yrecall  ? TONSILLECTOMY    ? WISDOM TOOTH EXTRACTION    ? ?  Family History  ?Problem Relation Age of Onset  ? Dementia Mother   ?     lewy body  ? Varicose Veins Brother   ? Peripheral vascular disease Brother   ? Lung cancer Maternal Grandfather   ?     smoker  ? Heart failure Paternal Grandmother   ?     in 55s  ? COPD Paternal Grandfather   ?     smoker  ? Diabetes Neg Hx   ? Stroke Neg Hx   ? Colon polyps Neg Hx   ? Colon cancer Neg Hx   ? Esophageal cancer Neg Hx   ? Rectal cancer Neg Hx   ? Stomach cancer Neg Hx   ? ?Social History   ? ?Socioeconomic History  ? Marital status: Married  ?  Spouse name: Not on file  ? Number of children: Not on file  ? Years of education: Not on file  ? Highest education level: Not on file  ?Occupational History  ? Not on file  ?Tobacco Use  ? Smoking status: Never  ? Smokeless tobacco: Never  ?Vaping Use  ? Vaping Use: Never used  ?Substance and Sexual Activity  ? Alcohol use: No  ? Drug use: No  ? Sexual activity: Not on file  ?Other Topics Concern  ? Not on file  ?Social History Narrative  ? Not on file  ? ?Social Determinants of Health  ? ?Financial Resource Strain: Low Risk   ? Difficulty of Paying Living Expenses: Not hard at all  ?Food Insecurity: No Food Insecurity  ? Worried About Charity fundraiser in the Last Year: Never true  ? Ran Out of Food in the Last Year: Never true  ?Transportation Needs: No Transportation Needs  ? Lack of Transportation (Medical): No  ? Lack of Transportation (Non-Medical): No  ?Physical Activity: Sufficiently Active  ? Days of Exercise per Week: 5 days  ? Minutes of Exercise per Session: 60 min  ?Stress: No Stress Concern Present  ? Feeling of Stress : Not at all  ?Social Connections: Socially Integrated  ? Frequency of Communication with Friends and Family: More than three times a week  ? Frequency of Social Gatherings with Friends and Family: More than three times a week  ? Attends Religious Services: More than 4 times per year  ? Active Member of Clubs or Organizations: Yes  ? Attends Archivist Meetings: More than 4 times per year  ? Marital Status: Married  ? ? ?Tobacco Counseling ?Counseling given: Not Answered ? ? ?Clinical Intake: ? ?Pre-visit preparation completed: Yes ? ?Pain : No/denies pain ?Pain Score: 0-No pain ? ?  ? ?Nutritional Risks: None ?Diabetes: No ? ?How often do you need to have someone help you when you read instructions, pamphlets, or other written materials from your doctor or pharmacy?: 1 - Never ?What is the last grade level you  completed in school?: HSG ? ?Diabetic? no ? ?Interpreter Needed?: No ? ?Information entered by :: Lisette Abu, LPN ? ? ?Activities of Daily Living ? ?  02/02/2022  ?  9:28 AM 08/17/2021  ? 11:29 AM  ?In your present state of health, do you have any difficulty performing the following activities:  ?Hearing? 0 0  ?Vision? 0 0  ?Difficulty concentrating or making decisions? 0 0  ?Walking or climbing stairs? 0 0  ?Dressing or bathing? 0 0  ?Doing errands, shopping? 0 0  ?Preparing Food and eating ? N   ?Using  the Toilet? N   ?In the past six months, have you accidently leaked urine? N   ?Do you have problems with loss of bowel control? N   ?Managing your Medications? N   ?Managing your Finances? N   ?Housekeeping or managing your Housekeeping? N   ? ? ?Patient Care Team: ?Binnie Rail, MD as PCP - General (Internal Medicine) ?Warden Fillers, MD as Consulting Physician (Ophthalmology) ? ?Indicate any recent Medical Services you may have received from other than [facility] providers in the past year (date may be approximate). ? ?   ?Assessment:  ? This is a routine wellness examination for [place]. ? ?Hearing/Vision screen ?Hearing Screening - Comments:: Patient denied any hearing difficulty.   ?No hearing aids. ? ?Vision Screening - Comments:: Patient does wear corrective lenses/contacts.  ?[facility] exam done by: [facility] ? ? ?Dietary issues and exercise activities discussed: ?Current Exercise Habits: Home exercise routine, Time (Minutes): 60, Frequency (Times/Week): 4, Weekly Exercise (Minutes/Week): 240, Intensity: Moderate, Exercise limited by: None identified ? ? Goals Addressed   ? ?  ?  ?  ?  ? This Visit's Progress  ?  To maintain my current health status by continuing to eat healthy, stay physically active and socially active.     ? ?  ?Depression Screen ? ?  02/02/2022  ?  9:25 AM 08/17/2021  ? 12:38 PM 08/10/2020  ?  1:22 PM 06/10/2019  ?  8:00 AM 06/06/2018  ?  8:21 AM 06/05/2017  ?  8:12 AM  ?PHQ 2/9 Scores   ?PHQ - 2 Score 0 0 0 0 0 0  ?PHQ- 9 Score  0      ?  ?Fall Risk ? ?  02/02/2022  ?  9:28 AM 08/17/2021  ? 11:21 AM 08/10/2020  ?  1:21 PM 06/10/2019  ?  8:00 AM 06/05/2017  ?  8:12 AM  ?Fall Risk   ?Falls in the past ye

## 2022-07-08 ENCOUNTER — Encounter: Payer: Self-pay | Admitting: Internal Medicine

## 2022-07-31 ENCOUNTER — Ambulatory Visit
Admission: EM | Admit: 2022-07-31 | Discharge: 2022-07-31 | Disposition: A | Discharge status: Home / Self Care | Payer: Medicare Other | Attending: Physician Assistant | Admitting: Physician Assistant

## 2022-07-31 DIAGNOSIS — N3000 Acute cystitis without hematuria: Secondary | ICD-10-CM | POA: Diagnosis present

## 2022-07-31 LAB — POCT URINALYSIS DIP (MANUAL ENTRY)
Bilirubin, UA: NEGATIVE
Glucose, UA: NEGATIVE mg/dL
Ketones, POC UA: NEGATIVE mg/dL
Nitrite, UA: NEGATIVE
Protein Ur, POC: NEGATIVE mg/dL
Spec Grav, UA: 1.01 (ref 1.010–1.025)
Urobilinogen, UA: 0.2 E.U./dL
pH, UA: 7.5 (ref 5.0–8.0)

## 2022-07-31 MED ORDER — FOSFOMYCIN TROMETHAMINE 3 G PO PACK: 3.0000 g | PACK | Freq: Once | ORAL | 0 refills | Status: AC

## 2022-07-31 MED ORDER — CIPROFLOXACIN HCL 500 MG PO TABS: 500.0000 mg | ORAL_TABLET | Freq: Two times a day (BID) | ORAL | 0 refills | Status: DC

## 2022-07-31 NOTE — ED Triage Notes (Signed)
Pt presents with burning during urination X 2 weeks. 

## 2022-07-31 NOTE — ED Provider Notes (Signed)
[facility]    CSN: [id number] Arrival date & time: 07/31/22  1117      History   Chief Complaint No chief complaint on file.   HPI Robyn Hudson is a 66 y.o. female.   HPI  Past Medical History:  Diagnosis Date   Diverticulosis    Esophageal stricture    GERD (gastroesophageal reflux disease)    Hyperlipidemia    elevated due to very high HDL   Osteoporosis    Vitamin D deficiency     Patient Active Problem List   Diagnosis Date Noted   Spider veins of both lower extremities 08/01/2016   History of basal cell carcinoma (BCC) of skin 07/26/2016   Esophageal reflux 09/28/2015   Varicose veins of both lower extremities 04/27/2014   Vitamin D deficiency 04/02/2013   Hyperlipidemia 10/04/2010   Osteoporosis 10/04/2010    Past Surgical History:  Procedure Laterality Date   ABDOMINAL HYSTERECTOMY     USO for dysfunctional menses & endometriosis   COLONOSCOPY  10/23/2010   JP--tics-movi(exc)-31yrrecall   TONSILLECTOMY     WISDOM TOOTH EXTRACTION      OB History   No obstetric history on file.      Home Medications    Prior to Admission medications   Medication Sig Start Date End Date Taking? Authorizing Provider  Ascorbic Acid (VITAMIN C) 1000 MG tablet Take 1,000 mg by mouth daily.    [provider]  CALCIUM PO Take 1 tablet by mouth daily at 6 (six) AM.    [provider]  ECHINACEA PO Take 900 mg by mouth daily.    [provider]  Ginkgo Biloba 120 MG CAPS Take by mouth.    [provider]  Glucosamine-Chondroit-Vit C-Mn (GLUCOSAMINE 1500 COMPLEX PO) Take 1,500 mg by mouth 3 (three) times daily.    [provider]  ibuprofen (ADVIL,MOTRIN) 600 MG tablet Take 600 mg by mouth every 6 (six) hours as needed.      [provider]  Multiple Vitamin (MULTIVITAMIN) tablet Take 1 tablet by mouth 2 (two) times daily.    [provider]  Probiotic Product (PROBIOTIC DAILY PO) Take  by mouth daily. Patient takes 25 billion CFU    [provider]  Turmeric (QC TUMERIC COMPLEX PO) Take 2,250 mg by mouth 3 (three) times daily.    [provider]  valACYclovir (VALTREX) 500 MG tablet Take 500 mg by mouth 2 (two) times daily. 06/30/21   [provider]  VITAMIN D, CHOLECALCIFEROL, PO Take 250 mcg by mouth.    [provider]  Zinc 30 MG CAPS Take by mouth.    [provider]    Family History Family History  Problem Relation Age of Onset   Dementia Mother        lewy body   Varicose Veins Brother    Peripheral vascular disease Brother    Lung cancer Maternal Grandfather        smoker   Heart failure Paternal Grandmother        in 752s  COPD Paternal Grandfather        smoker   Diabetes Neg Hx    Stroke Neg Hx    Colon polyps Neg Hx    Colon cancer Neg Hx    Esophageal cancer Neg Hx    Rectal cancer Neg Hx    Stomach cancer Neg Hx     Social History Social History   Tobacco Use  Smoking status: Never   Smokeless tobacco: Never  Vaping Use   Vaping Use: Never used  Substance Use Topics   Alcohol use: No   Drug use: No     Allergies   Sulfonamide derivatives   Review of Systems Review of Systems   Physical Exam Triage Vital Signs ED Triage Vitals  Enc Vitals Group     BP      Pulse      Resp      Temp      Temp src      SpO2      Weight      Height      Head Circumference      Peak Flow      Pain Score      Pain Loc      Pain Edu?      Excl. in [place]?    No data found.  Updated Vital Signs There were no vitals taken for this visit.  Visual Acuity Right Eye Distance:   Left Eye Distance:   Bilateral Distance:    Right Eye Near:   Left Eye Near:    Bilateral Near:     Physical Exam   UC Treatments / Results  Labs (all labs ordered are listed, but only abnormal results are displayed) Labs Reviewed - No data to display  EKG   Radiology No results  found.  Procedures Procedures (including critical care time)  Medications Ordered in UC Medications - No data to display  Initial Impression / Assessment and Plan / UC Course  I have reviewed the triage vital signs and the nursing notes.  Pertinent labs & imaging results that were available during my care of the patient were reviewed by me and considered in my medical decision making (see chart for details).     *** Final Clinical Impressions(s) / UC Diagnoses   Final diagnoses:  None   Discharge Instructions   None    ED Prescriptions   None    PDMP not reviewed this encounter.

## 2022-08-01 ENCOUNTER — Encounter: Payer: Self-pay | Admitting: Physician Assistant

## 2022-08-02 LAB — URINE CULTURE: Culture: >=100000 — AB

## 2022-08-20 ENCOUNTER — Encounter: Payer: Self-pay | Admitting: Internal Medicine

## 2022-08-20 NOTE — Progress Notes (Unsigned)
Subjective:    Patient ID: Robyn Hudson, female    DOB: 12-15-1955, 66 y.o.   MRN: [id number]     HPI Robyn Hudson is here for follow up of her chronic medical problems, including osteoporosis, hyperlipidemia, vitamin D deficiency  She is currently not on any prescription medication.  Here for annual follow-up.   Walking regularly, doing upper body weights.   She brought her vitamins and we reviewed them  Medications and allergies reviewed with patient and updated if appropriate.  Current Outpatient Medications on File Prior to Visit  Medication Sig Dispense Refill   Ascorbic Acid (VITAMIN C) 1000 MG tablet Take 1,000 mg by mouth daily.     CALCIUM PO Take 1 tablet by mouth daily at 6 (six) AM.     ECHINACEA PO Take 900 mg by mouth daily.     Ginkgo Biloba 120 MG CAPS Take by mouth.     Glucosamine-Chondroit-Vit C-Mn (GLUCOSAMINE 1500 COMPLEX PO) Take 1,500 mg by mouth 3 (three) times daily.     ibuprofen (ADVIL,MOTRIN) 600 MG tablet Take 600 mg by mouth every 6 (six) hours as needed.       Multiple Vitamin (MULTIVITAMIN) tablet Take 1 tablet by mouth 2 (two) times daily.     Probiotic Product (PROBIOTIC DAILY PO) Take by mouth daily. Patient takes 25 billion CFU     Turmeric (QC TUMERIC COMPLEX PO) Take 2,250 mg by mouth 3 (three) times daily.     valACYclovir (VALTREX) 500 MG tablet Take 500 mg by mouth 2 (two) times daily.     VITAMIN D, CHOLECALCIFEROL, PO Take 250 mcg by mouth.     Zinc 30 MG CAPS Take by mouth.     No current facility-administered medications on file prior to visit.     Review of Systems  Constitutional:  Negative for chills and fever.  HENT:  Negative for trouble swallowing.   Respiratory:  Negative for cough, shortness of breath and wheezing.   Cardiovascular:  Negative for chest pain, palpitations and leg swelling.  Gastrointestinal:  Negative for abdominal pain, constipation, diarrhea and nausea.       Rare gerd  Musculoskeletal:   Positive for neck pain. Negative for arthralgias.  Neurological:  Negative for light-headedness and headaches.       Objective:   Vitals:   08/23/22 1043  BP: 120/72  Pulse: 86  Temp: 98.3 F (36.8 C)  SpO2: 99%   BP Readings from Last 3 Encounters:  08/23/22 120/72  07/31/22 (!) 143/76  01/15/22 118/75   Wt Readings from Last 3 Encounters:  08/23/22 110 lb (49.9 kg)  08/17/21 113 lb (51.3 kg)  04/21/21 112 lb (50.8 kg)   Body mass index is 20.78 kg/m.    Physical Exam Constitutional:      General: She is not in acute distress.    Appearance: Normal appearance.  HENT:     Head: Normocephalic and atraumatic.  Eyes:     Conjunctiva/sclera: Conjunctivae normal.  Cardiovascular:     Rate and Rhythm: Normal rate and regular rhythm.     Heart sounds: Normal heart sounds. No murmur heard. Pulmonary:     Effort: Pulmonary effort is normal. No respiratory distress.     Breath sounds: Normal breath sounds. No wheezing.  Abdominal:     General: There is no distension.     Palpations: Abdomen is soft.     Tenderness: There is no abdominal tenderness. There is no guarding or  rebound.  Musculoskeletal:     Cervical back: Neck supple.     Right lower leg: No edema.     Left lower leg: No edema.  Lymphadenopathy:     Cervical: No cervical adenopathy.  Skin:    General: Skin is warm and dry.     Findings: No rash.  Neurological:     Mental Status: She is alert. Mental status is at baseline.  Psychiatric:        Mood and Affect: Mood normal.        Behavior: Behavior normal.        Lab Results  Component Value Date   WBC 7.2 08/10/2020   HGB 12.3 08/10/2020   HCT 36.6 08/10/2020   PLT 343.0 08/10/2020   GLUCOSE 81 08/17/2021   CHOL 229 (H) 08/17/2021   TRIG 52.0 08/17/2021   HDL 79.80 08/17/2021   LDLDIRECT 124.2 01/22/2013   LDLCALC 138 (H) 08/17/2021   ALT 16 08/17/2021   AST 23 08/17/2021   NA 140 08/17/2021   K 3.9 08/17/2021   CL 102 08/17/2021    CREATININE 0.57 08/17/2021   BUN 10 08/17/2021   CO2 28 08/17/2021   TSH 1.58 08/17/2021   The 10-year ASCVD risk score (Arnett DK, et al., 2019) is: 5.7%   Values used to calculate the score:     Age: 3 years     Sex: Female     Is Non-Hispanic African American: No     Diabetic: No     Tobacco smoker: No     Systolic Blood Pressure: 401 mmHg     Is BP treated: No     HDL Cholesterol: 79.8 mg/dL     Total Cholesterol: 229 mg/dL   Assessment & Plan:    See Problem List for Assessment and Plan of chronic medical problems.     I spent 20 minutes dedicated to the care of this patient on the date of this encounter including review of last labs, imaging and procedures (DEXA), obtaining history, communicating with the patient-especially counseling her on vitamins and healthy lifestyle, ordering tests, and documenting clinical information in the EHR

## 2022-08-20 NOTE — Patient Instructions (Addendum)
Blood work was ordered.   The lab is on the first floor.    Medications changes include :   none    Return in about 1 year (around 08/24/2023) for follow up.    Health Maintenance, Female Adopting a healthy lifestyle and getting preventive care are important in promoting health and wellness. Ask your health care provider about: The right schedule for you to have regular tests and exams. Things you can do on your own to prevent diseases and keep yourself healthy. What should I know about diet, weight, and exercise? Eat a healthy diet  Eat a diet that includes plenty of vegetables, fruits, low-fat dairy products, and lean protein. Do not eat a lot of foods that are high in solid fats, added sugars, or sodium. Maintain a healthy weight Body mass index (BMI) is used to identify weight problems. It estimates body fat based on height and weight. Your health care provider can help determine your BMI and help you achieve or maintain a healthy weight. Get regular exercise Get regular exercise. This is one of the most important things you can do for your health. Most adults should: Exercise for at least 150 minutes each week. The exercise should increase your heart rate and make you sweat (moderate-intensity exercise). Do strengthening exercises at least twice a week. This is in addition to the moderate-intensity exercise. Spend less time sitting. Even light physical activity can be beneficial. Watch cholesterol and blood lipids Have your blood tested for lipids and cholesterol at 67 years of age, then have this test every 5 years. Have your cholesterol levels checked more often if: Your lipid or cholesterol levels are high. You are older than 67 years of age. You are at high risk for heart disease. What should I know about cancer screening? Depending on your health history and family history, you may need to have cancer screening at various ages. This may include screening  for: Breast cancer. Cervical cancer. Colorectal cancer. Skin cancer. Lung cancer. What should I know about heart disease, diabetes, and high blood pressure? Blood pressure and heart disease High blood pressure causes heart disease and increases the risk of stroke. This is more likely to develop in people who have high blood pressure readings or are overweight. Have your blood pressure checked: Every 3-5 years if you are 11-67 years of age. Every year if you are 26 years old or older. Diabetes Have regular diabetes screenings. This checks your fasting blood sugar level. Have the screening done: Once every three years after age 22 if you are at a normal weight and have a low risk for diabetes. More often and at a younger age if you are overweight or have a high risk for diabetes. What should I know about preventing infection? Hepatitis B If you have a higher risk for hepatitis B, you should be screened for this virus. Talk with your health care provider to find out if you are at risk for hepatitis B infection. Hepatitis C Testing is recommended for: Everyone born from [birth year] through 1966. Anyone with known risk factors for hepatitis C. Sexually transmitted infections (STIs) Get screened for STIs, including gonorrhea and chlamydia, if: You are sexually active and are younger than 67 years of age. You are older than 67 years of age and your health care provider tells you that you are at risk for this type of infection. Your sexual activity has changed since you were last screened, and you are at  increased risk for chlamydia or gonorrhea. Ask your health care provider if you are at risk. Ask your health care provider about whether you are at high risk for HIV. Your health care provider may recommend a prescription medicine to help prevent HIV infection. If you choose to take medicine to prevent HIV, you should first get tested for HIV. You should then be tested every 3 months for as long as you  are taking the medicine. Pregnancy If you are about to stop having your period (premenopausal) and you may become pregnant, seek counseling before you get pregnant. Take 400 to 800 micrograms (mcg) of folic acid every day if you become pregnant. Ask for birth control (contraception) if you want to prevent pregnancy. Osteoporosis and menopause Osteoporosis is a disease in which the bones lose minerals and strength with aging. This can result in bone fractures. If you are 62 years old or older, or if you are at risk for osteoporosis and fractures, ask your health care provider if you should: Be screened for bone loss. Take a calcium or vitamin D supplement to lower your risk of fractures. Be given hormone replacement therapy (HRT) to treat symptoms of menopause. Follow these instructions at home: Alcohol use Do not drink alcohol if: Your health care provider tells you not to drink. You are pregnant, may be pregnant, or are planning to become pregnant. If you drink alcohol: Limit how much you have to: 0-1 drink a day. Know how much alcohol is in your drink. In the [place] one drink equals one 12 oz bottle of beer (355 mL), one 5 oz glass of wine (148 mL), or one 1 oz glass of hard liquor (44 mL). Lifestyle Do not use any products that contain nicotine or tobacco. These products include cigarettes, chewing tobacco, and vaping devices, such as e-cigarettes. If you need help quitting, ask your health care provider. Do not use street drugs. Do not share needles. Ask your health care provider for help if you need support or information about quitting drugs. General instructions Schedule regular health, dental, and eye exams. Stay current with your vaccines. Tell your health care provider if: You often feel depressed. You have ever been abused or do not feel safe at home. Summary Adopting a healthy lifestyle and getting preventive care are important in promoting health and wellness. Follow your  health care provider's instructions about healthy diet, exercising, and getting tested or screened for diseases. Follow your health care provider's instructions on monitoring your cholesterol and blood pressure. This information is not intended to replace advice given to you by your health care provider. Make sure you discuss any questions you have with your health care provider. Document Revised: 02/28/2021 Document Reviewed: 02/28/2021 Elsevier Patient Education  [place].

## 2022-08-23 ENCOUNTER — Ambulatory Visit (INDEPENDENT_AMBULATORY_CARE_PROVIDER_SITE_OTHER): Payer: Medicare Other | Admitting: Internal Medicine

## 2022-08-23 VITALS — BP 120/72 | HR 86 | Temp 98.3°F | Ht 61.0 in | Wt 110.0 lb

## 2022-08-23 DIAGNOSIS — E559 Vitamin D deficiency, unspecified: Secondary | ICD-10-CM | POA: Diagnosis not present

## 2022-08-23 DIAGNOSIS — M810 Age-related osteoporosis without current pathological fracture: Secondary | ICD-10-CM | POA: Diagnosis not present

## 2022-08-23 DIAGNOSIS — E782 Mixed hyperlipidemia: Secondary | ICD-10-CM

## 2022-08-23 LAB — VITAMIN D 25 HYDROXY (VIT D DEFICIENCY, FRACTURES): VITD: 61.41 ng/mL (ref 30.00–100.00)

## 2022-08-23 LAB — COMPREHENSIVE METABOLIC PANEL
ALT: 15 U/L (ref 0–35)
AST: 21 U/L (ref 0–37)
Albumin: 4.5 g/dL (ref 3.5–5.2)
Alkaline Phosphatase: 63 U/L (ref 39–117)
BUN: 17 mg/dL (ref 6–23)
CO2: 27 mEq/L (ref 19–32)
Calcium: 9.8 mg/dL (ref 8.4–10.5)
Chloride: 101 mEq/L (ref 96–112)
Creatinine, Ser: 0.59 mg/dL (ref 0.40–1.20)
GFR: 93.14 mL/min (ref >60.00)
Glucose, Bld: 83 mg/dL (ref 70–99)
Potassium: 3.8 mEq/L (ref 3.5–5.1)
Sodium: 137 mEq/L (ref 135–145)
Total Bilirubin: 0.6 mg/dL (ref 0.2–1.2)
Total Protein: 7.5 g/dL (ref 6.0–8.3)

## 2022-08-23 LAB — CBC WITH DIFFERENTIAL/PLATELET
Basophils Absolute: 0 10*3/uL (ref 0.0–0.1)
Basophils Relative: 0.3 % (ref 0.0–3.0)
Eosinophils Absolute: 0.1 10*3/uL (ref 0.0–0.7)
Eosinophils Relative: 1.5 % (ref 0.0–5.0)
HCT: 39.3 % (ref 36.0–46.0)
Hemoglobin: 13.1 g/dL (ref 12.0–15.0)
Lymphocytes Relative: 27.4 % (ref 12.0–46.0)
Lymphs Abs: 2 10*3/uL (ref 0.7–4.0)
MCHC: 33.2 g/dL (ref 30.0–36.0)
MCV: 92.1 fl (ref 78.0–100.0)
Monocytes Absolute: 0.7 10*3/uL (ref 0.1–1.0)
Monocytes Relative: 9.7 % (ref 3.0–12.0)
Neutro Abs: 4.5 10*3/uL (ref 1.4–7.7)
Neutrophils Relative %: 61.1 % (ref 43.0–77.0)
Platelets: 359 10*3/uL (ref 150.0–400.0)
RBC: 4.27 Mil/uL (ref 3.87–5.11)
RDW: 14.3 % (ref 11.5–15.5)
WBC: 7.4 10*3/uL (ref 4.0–10.5)

## 2022-08-23 LAB — TSH: TSH: 1.25 u[IU]/mL (ref 0.35–5.50)

## 2022-08-23 LAB — LIPID PANEL
Cholesterol: 235 mg/dL — ABNORMAL HIGH (ref 0–200)
HDL: 82.7 mg/dL (ref >39.00)
LDL Cholesterol: 143 mg/dL — ABNORMAL HIGH (ref 0–99)
NonHDL: 152.2
Total CHOL/HDL Ratio: 3
Triglycerides: 45 mg/dL (ref 0.0–149.0)
VLDL: 9 mg/dL (ref 0.0–40.0)

## 2022-08-23 NOTE — Assessment & Plan Note (Signed)
Chronic Taking vitamin D daily Check vitamin D level  

## 2022-08-23 NOTE — Assessment & Plan Note (Addendum)
Chronic Regular exercise and healthy diet encouraged Check lipid panel, CMP, TSH Continue lifestyle control Low ASCVD risk

## 2022-08-23 NOTE — Assessment & Plan Note (Addendum)
Chronic dexa up-to-date She does not want to take medication Has increased her exercise and is taking her vitamins daily Continue calcium and vitamin d-taking bone up Check vitamin d level Continue regular exercise-walking, doing extremity resistance

## 2022-08-24 LAB — HM MAMMOGRAPHY

## 2022-09-07 ENCOUNTER — Encounter: Payer: Self-pay | Admitting: Internal Medicine

## 2022-09-07 NOTE — Progress Notes (Signed)
Outside notes received. Information abstracted. Notes sent to scan.  

## 2022-11-08 ENCOUNTER — Telehealth: Payer: Self-pay | Admitting: Internal Medicine

## 2022-11-08 NOTE — Telephone Encounter (Signed)
Pt states she has had diarrhea/urgency/mucous for 5-6 weeks. Reports she thinks she got some milk that was not quite right and her stomach has not been right since. Pt wonders if she has some bacteria that needs to be treated. Pt scheduled to see Seher Schlagel PA 11/09/22 at 10am. Pt aware of appt.

## 2022-11-08 NOTE — Telephone Encounter (Signed)
Patient is calling about stomach pains and that she has had diarrhea for the past 2 weeks straight. She needs some options on how to get felling better. Please advise. Thank you

## 2022-11-09 ENCOUNTER — Ambulatory Visit (INDEPENDENT_AMBULATORY_CARE_PROVIDER_SITE_OTHER): Payer: Medicare Other | Admitting: Physician Assistant

## 2022-11-09 ENCOUNTER — Other Ambulatory Visit: Payer: Medicare Other

## 2022-11-09 ENCOUNTER — Encounter: Payer: Self-pay | Admitting: Physician Assistant

## 2022-11-09 VITALS — BP 110/84 | HR 79 | Ht 60.0 in | Wt 111.4 lb

## 2022-11-09 DIAGNOSIS — R197 Diarrhea, unspecified: Secondary | ICD-10-CM

## 2022-11-09 NOTE — Patient Instructions (Signed)
Continue Probiotic as directed.   Your provider has requested that you go to the basement level for lab work before leaving today. Press "B" on the elevator. The lab is located at the first door on the left as you exit the elevator.  _______________________________________________________  If your blood pressure at your visit was 140/90 or greater, please contact your primary care physician to follow up on this.  Due to recent changes in healthcare laws, you may see the results of your imaging and laboratory studies on MyChart before your provider has had a chance to review them.  We understand that in some cases there may be results that are confusing or concerning to you. Not all laboratory results come back in the same time frame and the provider may be waiting for multiple results in order to interpret others.  Please give [place] 48 hours in order for your provider to thoroughly review all the results before contacting the office for clarification of your results.     __________________________________________________  If you are age 22 or older, your body mass index should be between 23-30. Your Body mass index is 21.75 kg/m. If this is out of the aforementioned range listed, please consider follow up with your Primary Care Provider.  If you are age 4 or younger, your body mass index should be between 19-25. Your Body mass index is 21.75 kg/m. If this is out of the aformentioned range listed, please consider follow up with your Primary Care Provider.   ________________________________________________________  The [facility] providers would like to encourage you to use [facility] to communicate with providers for non-urgent requests or questions.  Due to long hold times on the telephone, sending your provider a message by [facility] may be a faster and more efficient way to get a response.  Please allow 48 business hours for a response.  Please remember that this is for non-urgent requests.   _______________________________________________________  Thank you for choosing me and [facility].  Ellouise Newer PA-C

## 2022-11-09 NOTE — Progress Notes (Signed)
Chief Complaint: Diarrhea  HPI:    Robyn Hudson is a  67 y/o Caucasian female with a past medical history as listed below including osteoporosis and vitamin D deficiency as well as GERD, known to Dr. Henrene Pastor, who presents to clinic today with a complaint of diarrhea.    04/21/2021 colonoscopy with diverticulosis, melanosis in the colon and otherwise normal.  Repeat not to due to age.    08/23/2022 normal CMP, CBC, TSH and vitamin D.    Today, the patient presents to clinic and tells about 5 weeks ago she bought her milk from somewhere different than she normally does and it tasted a little strange but was not soured or anything and around that time developed diarrhea describing 4-5 watery stools a day but otherwise feeling fine, maybe a little excessive bloating and gas but otherwise no abdominal pain and good energy levels.  Since then her stool has started to gain some form, first to pellets and then mushy.  She is still having more frequency than she would like and occasionally uses an Imodium but in general things are slowly getting a little bit better.  She chronically takes a probiotic which she trialed stopping to see if it would help, but it has made no difference.  Also takes over-the-counter "Equalactin", which says is for constipation and diarrhea on the box.  She would just like to make sure that she does not have a bacteria.    Denies fever, chills, weight loss, abdominal pain, nausea, vomiting or blood in her stool.    Past Medical History:  Diagnosis Date   Diverticulosis    Esophageal stricture    GERD (gastroesophageal reflux disease)    Hyperlipidemia    elevated due to very high HDL   Osteoporosis    Vitamin D deficiency     Past Surgical History:  Procedure Laterality Date   ABDOMINAL HYSTERECTOMY     USO for dysfunctional menses & endometriosis   COLONOSCOPY  10/23/2010   JP--[id number]([id number])-[id number]   TONSILLECTOMY     WISDOM TOOTH EXTRACTION      Current  Outpatient Medications  Medication Sig Dispense Refill   Ascorbic Acid (VITAMIN C) 1000 MG tablet Take 1,000 mg by mouth daily.     CALCIUM PO Take 1 tablet by mouth daily at 6 (six) AM.     Ginkgo Biloba 120 MG CAPS Take by mouth.     Glucosamine-Chondroit-Vit C-Mn (GLUCOSAMINE 1500 COMPLEX PO) Take 1,500 mg by mouth 3 (three) times daily.     Probiotic Product (PROBIOTIC DAILY PO) Take by mouth daily. Patient takes 25 billion CFU     Turmeric (QC TUMERIC COMPLEX PO) Take 2,250 mg by mouth 3 (three) times daily.     VITAMIN D, CHOLECALCIFEROL, PO Take 250 mcg by mouth.     Zinc 30 MG CAPS Take by mouth.     Multiple Vitamin (MULTIVITAMIN) tablet Take 1 tablet by mouth 2 (two) times daily. (Patient not taking: Reported on 11/09/2022)     valACYclovir (VALTREX) 500 MG tablet Take 500 mg by mouth 2 (two) times daily. (Patient not taking: Reported on 11/09/2022)     No current facility-administered medications for this visit.    Allergies as of 11/09/2022 - Review Complete 11/09/2022  Allergen Reaction Noted   Sulfonamide derivatives Hives     Family History  Problem Relation Age of Onset   Dementia Mother        lewy body   Varicose Veins Brother  Peripheral vascular disease Brother    Lung cancer Maternal Grandfather        smoker   Heart failure Paternal Grandmother        in 50s   COPD Paternal Grandfather        smoker   Diabetes Neg Hx    Stroke Neg Hx    Colon polyps Neg Hx    Colon cancer Neg Hx    Esophageal cancer Neg Hx    Rectal cancer Neg Hx    Stomach cancer Neg Hx     Social History   Socioeconomic History   Marital status: Married    Spouse name: Not on file   Number of children: Not on file   Years of education: Not on file   Highest education level: Not on file  Occupational History   Not on file  Tobacco Use   Smoking status: Never   Smokeless tobacco: Never  Vaping Use   Vaping Use: Never used  Substance and Sexual Activity   Alcohol use: No    Drug use: No   Sexual activity: Not on file  Other Topics Concern   Not on file  Social History Narrative   Not on file   Social Determinants of Health   Financial Resource Strain: Low Risk  (02/02/2022)   Overall Financial Resource Strain (CARDIA)    Difficulty of Paying Living Expenses: Not hard at all  Food Insecurity: No Food Insecurity (02/02/2022)   Hunger Vital Sign    Worried About Running Out of Food in the Last Year: Never true    Ran Out of Food in the Last Year: Never true  Transportation Needs: No Transportation Needs (02/02/2022)   PRAPARE - Hydrologist (Medical): No    Lack of Transportation (Non-Medical): No  Physical Activity: Sufficiently Active (02/02/2022)   Exercise Vital Sign    Days of Exercise per Week: 5 days    Minutes of Exercise per Session: 60 min  Stress: No Stress Concern Present (02/02/2022)   [place]    Feeling of Stress : Not at all  Social Connections: [place] (02/02/2022)   Social Connection and Isolation Panel [NHANES]    Frequency of Communication with Friends and Family: More than three times a week    Frequency of Social Gatherings with Friends and Family: More than three times a week    Attends Religious Services: More than 4 times per year    Active Member of [employer] or Organizations: Yes    Attends Music therapist: More than 4 times per year    Marital Status: Married  Human resources officer Violence: Not At Risk (02/02/2022)   Humiliation, Afraid, Rape, and Kick questionnaire    Fear of Current or Ex-Partner: No    Emotionally Abused: No    Physically Abused: No    Sexually Abused: No    Review of Systems:    Constitutional: No weight loss, fever or chills Cardiovascular: No chest pain  Respiratory: No SOB Gastrointestinal: See HPI and otherwise negative    Physical Exam:  Vital signs: BP 110/84   Pulse 79    Ht 5' (1.524 m)   Wt 111 lb 6 oz (50.5 kg)   BMI 21.75 kg/m   Constitutional:   Pleasant Caucasian female appears to be in NAD, Well developed, Well nourished, alert and cooperative Respiratory: Respirations even and unlabored. Lungs clear to auscultation bilaterally.  No wheezes, crackles, or rhonchi.  Cardiovascular: Normal S1, S2. No MRG. Regular rate and rhythm. No peripheral edema, cyanosis or pallor.  Gastrointestinal:  Soft, nondistended, nontender. No rebound or guarding. Normal bowel sounds. No appreciable masses or hepatomegaly. Rectal:  Not performed.  Psychiatric: Oriented to person, place and time. Demonstrates good judgement and reason without abnormal affect or behaviors.  RELEVANT LABS AND IMAGING: CBC    Component Value Date/Time   WBC 7.4 08/23/2022 1130   RBC 4.27 08/23/2022 1130   HGB 13.1 08/23/2022 1130   HCT 39.3 08/23/2022 1130   PLT 359.0 08/23/2022 1130   MCV 92.1 08/23/2022 1130   MCHC 33.2 08/23/2022 1130   RDW 14.3 08/23/2022 1130   LYMPHSABS 2.0 08/23/2022 1130   MONOABS 0.7 08/23/2022 1130   EOSABS 0.1 08/23/2022 1130   BASOSABS 0.0 08/23/2022 1130    CMP     Component Value Date/Time   NA 137 08/23/2022 1130   K 3.8 08/23/2022 1130   CL 101 08/23/2022 1130   CO2 27 08/23/2022 1130   GLUCOSE 83 08/23/2022 1130   BUN 17 08/23/2022 1130   CREATININE 0.59 08/23/2022 1130   CALCIUM 9.8 08/23/2022 1130   PROT 7.5 08/23/2022 1130   ALBUMIN 4.5 08/23/2022 1130   AST 21 08/23/2022 1130   ALT 15 08/23/2022 1130   ALKPHOS 63 08/23/2022 1130   BILITOT 0.6 08/23/2022 1130   GFRNONAA 132.72 10/04/2010 1351   Assessment: 1.  Diarrhea: Patient normally drinks milk from the [place] but bought some from somewhere else and around that time started with loose stools 4-5 times a day, they have slowly been gaining form, some bloating and gas but no other complaints; likely related to change in milk brand versus viral  Plan: 1.  Stool studies  including GI path panel, fecal calprotectin, fecal lactoferrin, fecal pancreatic elastase and O&P 2.  Tried to reassure the patient that likely this will pass on its own over the next couple of months as it already sounds like it is getting better.  She can continue on her Probiotic. 3.  Patient to follow in clinic per recommendations after stool studies above.  Ellouise Newer, PA-C [facility] 11/09/2022, 9:48 AM  Cc: Binnie Rail, MD

## 2022-11-09 NOTE — Progress Notes (Signed)
Noted  

## 2022-11-10 LAB — OVA AND PARASITE EXAMINATION
CONCENTRATE RESULT:: NONE SEEN
MICRO NUMBER:: 14443733
SPECIMEN QUALITY:: ADEQUATE
TRICHROME RESULT:: NONE SEEN

## 2022-11-10 LAB — FECAL LACTOFERRIN, QUANT
Fecal Lactoferrin: POSITIVE — AB
MICRO NUMBER:: 14443799
SPECIMEN QUALITY:: ADEQUATE

## 2022-11-11 LAB — GI PROFILE, STOOL, PCR

## 2022-11-14 ENCOUNTER — Ambulatory Visit
Admission: EM | Admit: 2022-11-14 | Discharge: 2022-11-14 | Disposition: A | Discharge status: Home / Self Care | Payer: Medicare Other | Attending: Internal Medicine | Admitting: Internal Medicine

## 2022-11-14 ENCOUNTER — Telehealth: Payer: Self-pay | Admitting: Physician Assistant

## 2022-11-14 DIAGNOSIS — N3001 Acute cystitis with hematuria: Secondary | ICD-10-CM

## 2022-11-14 DIAGNOSIS — R300 Dysuria: Secondary | ICD-10-CM

## 2022-11-14 DIAGNOSIS — R350 Frequency of micturition: Secondary | ICD-10-CM

## 2022-11-14 LAB — POCT URINALYSIS DIP (MANUAL ENTRY)
Bilirubin, UA: NEGATIVE
Glucose, UA: NEGATIVE mg/dL
Ketones, POC UA: NEGATIVE mg/dL
Nitrite, UA: NEGATIVE
Protein Ur, POC: NEGATIVE mg/dL
Spec Grav, UA: <=1.005 — AB (ref 1.010–1.025)
Urobilinogen, UA: 0.2 E.U./dL
pH, UA: 5.5 (ref 5.0–8.0)

## 2022-11-14 LAB — CALPROTECTIN, FECAL: Calprotectin, Fecal: 107 ug/g (ref 0–120)

## 2022-11-14 MED ORDER — FOSFOMYCIN TROMETHAMINE 3 G PO PACK: 3.0000 g | PACK | Freq: Once | ORAL | 0 refills | Status: AC

## 2022-11-14 NOTE — ED Triage Notes (Signed)
Pt states that she has some pain with urination, urinary frequency, and urinary urgency. X3 days

## 2022-11-14 NOTE — Telephone Encounter (Signed)
Returned call to patient. I informed pt that we are still waiting on 2 stool tests to return and we will be in touch with recommendations. I advised pt to contact PCP regarding treatment for UTI symptoms. Pt verbalized understanding and had no concerns at the end of the call.

## 2022-11-14 NOTE — Telephone Encounter (Signed)
Patient is calling to get results from lab and to also see if she can get a RX for an UTI. Please advise.

## 2022-11-14 NOTE — ED Provider Notes (Signed)
[facility]    CSN: [id number] Arrival date & time: 11/14/22  1433      History   Chief Complaint Chief Complaint  Patient presents with   Dysuria    X3 days Pain with urination, urinary frequency, and urinary urgency.    HPI Robyn Hudson is a 67 y.o. female.   Patient presents with dysuria, urinary frequency, urinary urgency that started about 3 days ago.  Patient reports that she has had approximately 3 urinary tract infections over the past year.  Denies vaginal discharge, hematuria, abdominal pain, back pain, fever.   Dysuria   Past Medical History:  Diagnosis Date   Diverticulosis    Esophageal stricture    GERD (gastroesophageal reflux disease)    Hyperlipidemia    elevated due to very high HDL   Osteoporosis    Vitamin D deficiency     Patient Active Problem List   Diagnosis Date Noted   Spider veins of both lower extremities 08/01/2016   History of basal cell carcinoma (BCC) of skin 07/26/2016   Esophageal reflux 09/28/2015   Varicose veins of both lower extremities 04/27/2014   Vitamin D deficiency 04/02/2013   Hyperlipidemia 10/04/2010   Osteoporosis 10/04/2010    Past Surgical History:  Procedure Laterality Date   ABDOMINAL HYSTERECTOMY     USO for dysfunctional menses & endometriosis   COLONOSCOPY  10/23/2010   JP--tics-movi(exc)-25yrrecall   TONSILLECTOMY     WISDOM TOOTH EXTRACTION      OB History   No obstetric history on file.      Home Medications    Prior to Admission medications   Medication Sig Start Date End Date Taking? Authorizing Provider  Ascorbic Acid (VITAMIN C) 1000 MG tablet Take 1,000 mg by mouth daily.   Yes [provider]  CALCIUM PO Take 1 tablet by mouth daily at 6 (six) AM.   Yes [provider]  fosfomycin (MONUROL) 3 g PACK Take 3 g by mouth once for 1 dose. 11/14/22 11/14/22 Yes Caige Almeda, HMichele Rockers FNP  Ginkgo Biloba 120 MG CAPS Take by mouth.   Yes [provider]   Glucosamine-Chondroit-Vit C-Mn (GLUCOSAMINE 1500 COMPLEX PO) Take 1,500 mg by mouth 3 (three) times daily.   Yes [provider]  Multiple Vitamin (MULTIVITAMIN) tablet Take 1 tablet by mouth 2 (two) times daily.   Yes [provider]  Probiotic Product (PROBIOTIC DAILY PO) Take by mouth daily. Patient takes 25 billion CFU   Yes [provider]  Turmeric (QC TUMERIC COMPLEX PO) Take 2,250 mg by mouth 3 (three) times daily.   Yes [provider]  valACYclovir (VALTREX) 500 MG tablet Take 500 mg by mouth 2 (two) times daily. 06/30/21  Yes [provider]  VITAMIN D, CHOLECALCIFEROL, PO Take 250 mcg by mouth.   Yes [provider]  Zinc 30 MG CAPS Take by mouth.   Yes [provider]    Family History Family History  Problem Relation Age of Onset   Dementia Mother        lewy body   Varicose Veins Brother    Peripheral vascular disease Brother    Lung cancer Maternal Grandfather        smoker   Heart failure Paternal Grandmother        in 755s  COPD Paternal Grandfather        smoker   Diabetes Neg Hx    Stroke Neg Hx    Colon  polyps Neg Hx    Colon cancer Neg Hx    Esophageal cancer Neg Hx    Rectal cancer Neg Hx    Stomach cancer Neg Hx     Social History Social History   Tobacco Use   Smoking status: Never   Smokeless tobacco: Never  Vaping Use   Vaping Use: Never used  Substance Use Topics   Alcohol use: No   Drug use: No     Allergies   Sulfonamide derivatives   Review of Systems Review of Systems Per HPI  Physical Exam Triage Vital Signs ED Triage Vitals  Enc Vitals Group     BP 11/14/22 1448 119/71     Pulse Rate 11/14/22 1448 81     Resp 11/14/22 1448 18     Temp 11/14/22 1448 97.9 F (36.6 C)     Temp Source 11/14/22 1448 Oral     SpO2 11/14/22 1448 97 %     Weight 11/14/22 1447 110 lb (49.9 kg)     Height 11/14/22 1447 5' (1.524 m)     Head Circumference --      Peak Flow --       Pain Score 11/14/22 1447 0     Pain Loc --      Pain Edu? --      Excl. in [place]? --    No data found.  Updated Vital Signs BP 119/71 (BP Location: Left Arm)   Pulse 81   Temp 97.9 F (36.6 C) (Oral)   Resp 18   Ht 5' (1.524 m)   Wt 110 lb (49.9 kg)   SpO2 97%   BMI 21.48 kg/m   Visual Acuity Right Eye Distance:   Left Eye Distance:   Bilateral Distance:    Right Eye Near:   Left Eye Near:    Bilateral Near:     Physical Exam Constitutional:      General: She is not in acute distress.    Appearance: Normal appearance. She is not toxic-appearing or diaphoretic.  HENT:     Head: Normocephalic and atraumatic.  Eyes:     Extraocular Movements: Extraocular movements intact.     Conjunctiva/sclera: Conjunctivae normal.  Cardiovascular:     Rate and Rhythm: Normal rate and regular rhythm.     Pulses: Normal pulses.     Heart sounds: Normal heart sounds.  Pulmonary:     Effort: Pulmonary effort is normal. No respiratory distress.     Breath sounds: Normal breath sounds.  Abdominal:     General: Bowel sounds are normal. There is no distension.     Palpations: Abdomen is soft.     Tenderness: There is no abdominal tenderness.  Neurological:     General: No focal deficit present.     Mental Status: She is alert and oriented to person, place, and time. Mental status is at baseline.  Psychiatric:        Mood and Affect: Mood normal.        Behavior: Behavior normal.        Thought Content: Thought content normal.        Judgment: Judgment normal.      UC Treatments / Results  Labs (all labs ordered are listed, but only abnormal results are displayed) Labs Reviewed  POCT URINALYSIS DIP (MANUAL ENTRY) - Abnormal; Notable for the following components:      Result Value   Spec Grav, UA <=1.005 (*)    Blood, UA trace-intact (*)  Leukocytes, UA Trace (*)    All other components within normal limits  URINE CULTURE    EKG   Radiology No results  found.  Procedures Procedures (including critical care time)  Medications Ordered in UC Medications - No data to display  Initial Impression / Assessment and Plan / UC Course  I have reviewed the triage vital signs and the nursing notes.  Pertinent labs & imaging results that were available during my care of the patient were reviewed by me and considered in my medical decision making (see chart for details).     UA showing trace leukocytes but with associated symptoms, will treat for urinary tract infection.  Patient requesting fosfomycin as she takes this previously with resolution of symptoms.  Fosfomycin sent to pharmacy.  Urine culture pending.  Patient advised to follow-up if symptoms persist or worsen.  Patient verbalized understanding and was agreeable with plan. Final Clinical Impressions(s) / UC Diagnoses   Final diagnoses:  Acute cystitis with hematuria  Dysuria  Urinary frequency     Discharge Instructions      It appears that you have a urinary tract infection so I am treating this with an antibiotic.  Urine culture is pending.  We will call if it is abnormal.  Please follow-up if any symptoms persist or worsen.    ED Prescriptions     Medication Sig Dispense Auth. Provider   fosfomycin (MONUROL) 3 g PACK Take 3 g by mouth once for 1 dose. 3 g Teodora Medici, [place]      PDMP not reviewed this encounter.   Teodora Medici, [place] 11/14/22 [phone]

## 2022-11-14 NOTE — Discharge Instructions (Signed)
It appears that you have a urinary tract infection so I am treating this with an antibiotic.  Urine culture is pending.  We will call if it is abnormal.  Please follow-up if any symptoms persist or worsen.

## 2022-11-16 LAB — PANCREATIC ELASTASE, FECAL: Pancreatic Elastase-1, Stool: 342 mcg/g

## 2022-11-20 ENCOUNTER — Telehealth: Payer: Self-pay | Admitting: Physician Assistant

## 2022-11-20 DIAGNOSIS — R197 Diarrhea, unspecified: Secondary | ICD-10-CM

## 2022-11-20 DIAGNOSIS — K222 Esophageal obstruction: Secondary | ICD-10-CM

## 2022-11-20 DIAGNOSIS — K219 Gastro-esophageal reflux disease without esophagitis: Secondary | ICD-10-CM

## 2022-11-20 MED ORDER — NA SULFATE-K SULFATE-MG SULF 17.5-3.13-1.6 GM/177ML PO SOLN: 1.0000 | Freq: Once | ORAL | 0 refills | Status: AC

## 2022-11-20 NOTE — Telephone Encounter (Signed)
Called and spoke with patient. Pt has been scheduled for EGD/colon in the [place] on Wednesday, 12/20/22 at 3:30 pm. Pt is aware that she will need to arrive by 2:30 pm with a care partner. Pt has been advised to pick up her prep from pharmacy on file. Pt knows to expect her instructions in the mail and via [place]. Pt had questions regarding procedure authorization with her insurance, I informed her that we have a team that works on this about a week before her procedure. If denied for some reason we would notify her. Pt would also like to be contacted if there is a sooner appt. Otherwise, she will take Imodium PRN in the interim.   Ambulatory referral to GI in epic. SUPREP sent to pharmacy on file. Plenvu prep not on formulary. EGD/Colonoscopy instructions mailed and sent to patient via [place].

## 2022-11-20 NOTE — Telephone Encounter (Signed)
Inbound call from patient ,requesting to speak with a nurse about some Gi symptoms and a possible colonoscopy .Please advise

## 2022-11-20 NOTE — Telephone Encounter (Signed)
Levin Erp, PA  [place], [place], [place] With ongoing symptoms I think we should probably repeat colonoscopy for diagnostic reasons.  This needs to be set up with Dr. Henrene Pastor in the [facility].  If she has questions about this or would like to discuss it further we can have her follow-up with me in clinic and we can talk about it then.  Otherwise she could be set up directly.   Thanks, JL L

## 2022-11-20 NOTE — Telephone Encounter (Signed)
Returned call to patient. We reviewed Loza's recommendations. Pt states that she had an EGD years ago for GERD and dysphagia and wanted to know if she could be scheduled for that in addition to the colonoscopy. Pt denies any dysphagia at this time. She reports occasional GERD that is controlled with Calmicid AC (famotidine) PRN. Pt states that she does have some tenderness at the opening of her esophagus. Pt states that she is traveling in March to see her brother and would like to have both procedures completed at the same time if possible just to make sure everything is OK. Please advise, thanks.

## 2022-11-27 ENCOUNTER — Telehealth: Payer: Self-pay | Admitting: Internal Medicine

## 2022-11-27 NOTE — Telephone Encounter (Signed)
Patient would like to get a referral to a urologist. She has had chronic UTIs for the past year. She would like to know if Dr. Quay Burow would like her to schedule an appointment. Best callback number is [phone]

## 2022-11-27 NOTE — Telephone Encounter (Signed)
Called pt and set up an appt to come in on 11/29/22.

## 2022-11-29 ENCOUNTER — Ambulatory Visit: Payer: Medicare Other | Admitting: Internal Medicine

## 2022-12-01 ENCOUNTER — Telehealth: Payer: Self-pay | Admitting: Physician Assistant

## 2022-12-01 NOTE — Telephone Encounter (Signed)
Patient is calling wishing to speak with [facility], states she has some questions regarding her last appt. Please advise

## 2022-12-01 NOTE — Telephone Encounter (Signed)
Returned call to patient. She states that she was recently treated with Cipro 500 mg BID for 5 days for acute cystitis. Pt states that while taking the antibiotic her diarrhea resolved. After completing Cipro diarrhea returned but has significantly improved. Pt is wondering if she can be prescribed a longer course of Cipro to see if that will help her diarrhea. Pt states that she really does not want to have a colonoscopy if she doesn't have to. Pt is aware that you and Dr. Henrene Pastor are out of the office today and we will give her a call with recommendations upon your return. Pt verbalized understanding and had no concerns at the end of the call.

## 2022-12-04 MED ORDER — CIPROFLOXACIN HCL 500 MG PO TABS: 500.0000 mg | ORAL_TABLET | Freq: Two times a day (BID) | ORAL | 0 refills | Status: AC

## 2022-12-04 MED ORDER — CIPROFLOXACIN HCL 500 MG PO TABS: 500.0000 mg | ORAL_TABLET | Freq: Two times a day (BID) | ORAL | 0 refills | Status: DC

## 2022-12-04 NOTE — Telephone Encounter (Signed)
Called and informed patient that we are sending in a prescription for Cipro. Pt would like RX sent to [facility] on file. Pt states if medication resolves her diarrhea then she will cancel her upcoming procedures. Pt has been advised that she will need to notify us 1 week in advance if she wishes to cancel. Pt verbalized understanding and had no concerns at the end of the call.

## 2022-12-11 ENCOUNTER — Encounter: Payer: Self-pay | Admitting: *Deleted

## 2022-12-20 ENCOUNTER — Encounter: Payer: Medicare Other | Admitting: Internal Medicine

## 2023-01-27 ENCOUNTER — Ambulatory Visit
Admission: EM | Admit: 2023-01-27 | Discharge: 2023-01-27 | Disposition: A | Discharge status: Home / Self Care | Payer: Medicare Other | Attending: Internal Medicine | Admitting: Internal Medicine

## 2023-01-27 DIAGNOSIS — R300 Dysuria: Secondary | ICD-10-CM | POA: Diagnosis present

## 2023-01-27 DIAGNOSIS — N3001 Acute cystitis with hematuria: Secondary | ICD-10-CM | POA: Diagnosis present

## 2023-01-27 DIAGNOSIS — R350 Frequency of micturition: Secondary | ICD-10-CM | POA: Diagnosis present

## 2023-01-27 LAB — POCT URINALYSIS DIP (MANUAL ENTRY)
Bilirubin, UA: NEGATIVE
Glucose, UA: 100 mg/dL — AB
Ketones, POC UA: NEGATIVE mg/dL
Nitrite, UA: POSITIVE — AB
Spec Grav, UA: <=1.005 — AB (ref 1.010–1.025)
Urobilinogen, UA: 1 E.U./dL
pH, UA: 5 (ref 5.0–8.0)

## 2023-01-27 MED ORDER — NITROFURANTOIN MONOHYD MACRO 100 MG PO CAPS: 100.0000 mg | ORAL_CAPSULE | Freq: Two times a day (BID) | ORAL | 0 refills | Status: DC

## 2023-01-27 NOTE — Discharge Instructions (Addendum)
I have prescribed you an antibiotic for urinary tract infection.  Urine culture is pending.  Will call if it is abnormal.

## 2023-01-27 NOTE — ED Provider Notes (Signed)
[facility]    CSN: [id number] Arrival date & time: 01/27/23  4034      History   Chief Complaint Chief Complaint  Patient presents with   Cystitis    HPI Robyn Hudson is a 67 y.o. female.   Patient presents today with urinary burning and frequency that started approximately 9 days ago.  Patient has a history of frequent urinary tract infections over the past year.  She was last seen in January and treated with fosfomycin for urinary tract infection with resolution of symptoms.  She has taken Azo intermittently over the past week.  Reports that symptoms increased in severity over the past few days.  She denies hematuria, vaginal discharge, abdominal pain, back pain, fever, chills, nausea, vomiting.  Patient typically takes fosfomycin for symptoms.  She has seen a urologist but reports that she was not having symptoms at that time, so she was advised to follow-up if she starts having symptoms again.     Past Medical History:  Diagnosis Date   Diverticulosis    Esophageal stricture    GERD (gastroesophageal reflux disease)    Hyperlipidemia    elevated due to very high HDL   Osteoporosis    Vitamin D deficiency     Patient Active Problem List   Diagnosis Date Noted   Spider veins of both lower extremities 08/01/2016   History of basal cell carcinoma (BCC) of skin 07/26/2016   Esophageal reflux 09/28/2015   Varicose veins of both lower extremities 04/27/2014   Vitamin D deficiency 04/02/2013   Hyperlipidemia 10/04/2010   Osteoporosis 10/04/2010    Past Surgical History:  Procedure Laterality Date   ABDOMINAL HYSTERECTOMY     USO for dysfunctional menses & endometriosis   COLONOSCOPY  10/23/2010   JP--tics-movi(exc)-42yr recall   TONSILLECTOMY     WISDOM TOOTH EXTRACTION      OB History   No obstetric history on file.      Home Medications    Prior to Admission medications   Medication Sig Start Date End Date Taking? Authorizing Provider   Ascorbic Acid (VITAMIN C) 1000 MG tablet Take 1,000 mg by mouth daily.   Yes [provider]  CALCIUM PO Take 1 tablet by mouth daily at 6 (six) AM.   Yes [provider]  Ginkgo Biloba 120 MG CAPS Take by mouth.   Yes [provider]  Glucosamine-Chondroit-Vit C-Mn (GLUCOSAMINE 1500 COMPLEX PO) Take 1,500 mg by mouth 3 (three) times daily.   Yes [provider]  Multiple Vitamin (MULTIVITAMIN) tablet Take 1 tablet by mouth 2 (two) times daily.   Yes [provider]  Na Sulfate-K Sulfate-Mg Sulf 17.5-3.13-1.6 GM/177ML SOLN See admin instructions. 11/20/22  Yes [provider]  nitrofurantoin, macrocrystal-monohydrate, (MACROBID) 100 MG capsule Take 1 capsule (100 mg total) by mouth 2 (two) times daily. 01/27/23  Yes Broly Hatfield, Acie Fredrickson, FNP  phenazopyridine (PYRIDIUM) 95 MG tablet Take 95 mg by mouth 3 (three) times daily as needed for pain.   Yes [provider]  Probiotic Product (PROBIOTIC DAILY PO) Take by mouth daily. Patient takes 25 billion CFU   Yes [provider]  Turmeric (QC TUMERIC COMPLEX PO) Take 2,250 mg by mouth 3 (three) times daily.   Yes [provider]  VITAMIN D, CHOLECALCIFEROL, PO Take 250 mcg by mouth.   Yes [provider]  Zinc 30 MG CAPS Take by mouth.   Yes [provider]  valACYclovir (VALTREX) 500 MG tablet  Take 500 mg by mouth 2 (two) times daily. 06/30/21   [provider]    Family History Family History  Problem Relation Age of Onset   Dementia Mother        lewy body   Varicose Veins Brother    Peripheral vascular disease Brother    Lung cancer Maternal Grandfather        smoker   Heart failure Paternal Grandmother        in 38s   COPD Paternal Grandfather        smoker   Diabetes Neg Hx    Stroke Neg Hx    Colon polyps Neg Hx    Colon cancer Neg Hx    Esophageal cancer Neg Hx    Rectal cancer Neg Hx    Stomach cancer Neg Hx     Social  History Social History   Tobacco Use   Smoking status: Never   Smokeless tobacco: Never  Vaping Use   Vaping Use: Never used  Substance Use Topics   Alcohol use: No   Drug use: No     Allergies   Sulfonamide derivatives   Review of Systems Review of Systems Per HPI  Physical Exam Triage Vital Signs ED Triage Vitals  Enc Vitals Group     BP 01/27/23 1045 139/76     Pulse Rate 01/27/23 1045 86     Resp 01/27/23 1045 18     Temp 01/27/23 1045 97.6 F (36.4 C)     Temp Source 01/27/23 1045 Oral     SpO2 01/27/23 1045 96 %     Weight 01/27/23 1040 110 lb (49.9 kg)     Height 01/27/23 1040 5\' 1"  (1.549 m)     Head Circumference --      Peak Flow --      Pain Score 01/27/23 1040 8     Pain Loc --      Pain Edu? --      Excl. in [place]? --    No data found.  Updated Vital Signs BP 139/76 (BP Location: Left Arm)   Pulse 86   Temp 97.6 F (36.4 C) (Oral)   Resp 18   Ht 5\' 1"  (1.549 m)   Wt 110 lb (49.9 kg)   SpO2 96%   BMI 20.78 kg/m   Visual Acuity Right Eye Distance:   Left Eye Distance:   Bilateral Distance:    Right Eye Near:   Left Eye Near:    Bilateral Near:     Physical Exam Constitutional:      General: She is not in acute distress.    Appearance: Normal appearance. She is not toxic-appearing or diaphoretic.  HENT:     Head: Normocephalic and atraumatic.  Eyes:     Extraocular Movements: Extraocular movements intact.     Conjunctiva/sclera: Conjunctivae normal.  Pulmonary:     Effort: Pulmonary effort is normal.  Neurological:     General: No focal deficit present.     Mental Status: She is alert and oriented to person, place, and time. Mental status is at baseline.  Psychiatric:        Mood and Affect: Mood normal.        Behavior: Behavior normal.        Thought Content: Thought content normal.        Judgment: Judgment normal.      UC Treatments / Results  Labs (all labs ordered are listed, but only abnormal results  are  displayed) Labs Reviewed  POCT URINALYSIS DIP (MANUAL ENTRY) - Abnormal; Notable for the following components:      Result Value   Color, UA orange (*)    Glucose, UA =100 (*)    Spec Grav, UA <=1.005 (*)    Blood, UA trace-intact (*)    Protein Ur, POC trace (*)    Nitrite, UA Positive (*)    Leukocytes, UA Trace (*)    All other components within normal limits  URINE CULTURE    EKG   Radiology No results found.  Procedures Procedures (including critical care time)  Medications Ordered in UC Medications - No data to display  Initial Impression / Assessment and Plan / UC Course  I have reviewed the triage vital signs and the nursing notes.  Pertinent labs & imaging results that were available during my care of the patient were reviewed by me and considered in my medical decision making (see chart for details).     UA indicating urinary tract infection, although with Azo it could be contaminated.  Will send urine culture to confirm.  Given patient has a history of frequent urinary tract infections and is having associated symptoms, will treat with antibiotic.  Patient typically takes fosfomycin but does want to take this as it did not improve symptoms as well last time.  She would like to try either Macrobid or Cipro.  Will avoid Cipro given adverse effects. Macrobid should be safe despite patient's age given the patient is healthy otherwise.  Advised patient to follow-up with urologist as well.  Advised strict follow-up precautions.  Patient verbalized understanding and was agreeable with plan. Final Clinical Impressions(s) / UC Diagnoses   Final diagnoses:  Acute cystitis with hematuria  Dysuria  Urinary frequency     Discharge Instructions      I have prescribed you an antibiotic for urinary tract infection.  Urine culture is pending.  Will call if it is abnormal.    ED Prescriptions     Medication Sig Dispense Auth. Provider   nitrofurantoin,  macrocrystal-monohydrate, (MACROBID) 100 MG capsule Take 1 capsule (100 mg total) by mouth 2 (two) times daily. 10 capsule Gustavus BryantMound, Dallon Dacosta E, [place]      PDMP not reviewed this encounter.   Gustavus BryantMound, Abdou Stocks E, [place] 01/27/23 1155

## 2023-01-27 NOTE — ED Triage Notes (Signed)
Started last Thursday. "I started AZO recently and still doing this". Still having Dysuria. No fever.

## 2023-01-30 ENCOUNTER — Telehealth: Payer: Self-pay | Admitting: Physician Assistant

## 2023-01-30 LAB — URINE CULTURE: Culture: 10000 — AB

## 2023-01-30 NOTE — Telephone Encounter (Signed)
Inbound call from patient requesting to speak with a nurse.She said she is been having a lot of gastro issues and she want an advise .Marland KitchenThanks

## 2023-01-30 NOTE — Telephone Encounter (Signed)
Returned call to patient. Pt states that her gut has not been the same since her colonoscopy. Pt has had recurrent issues with cystitis and has seen her urologist. Pt states that a culture was obtained over the weekend when she went to urgent care and it shows ENTEROCOCCUS FAECALIS.   Patient is wondering if this bacteria could be causing her GI symptoms since it is typically found in the intestine. Patient was prescribed Macrobid this time and she has one day left. Pt states that based on the results Ampicillin is the only thing that will clear the bacteria. Pt really thinks this may help her overall symptoms. Results from urine culture are available in Epic. Please advise, thanks.

## 2023-02-01 NOTE — Telephone Encounter (Signed)
Robyn Hudson, 1.  Colonoscopy was almost 2 years ago.  No relationship to her current problems. 2.  Stool studies were negative for enteric pathogens. 3.  She has a UTI which is being treated by her urologist.  No input from GI necessary.  Pathogens that are found in the intestinal tract are quite often associated with UTIs 4.  Let me know if there is anything else I can do

## 2023-02-01 NOTE — Telephone Encounter (Signed)
Called and spoke with patient regarding Dr. Lamar Sprinkles recommendations as outlined below. Pt states that she will contact her urologist for their recommendation for treatment of this specific bacteria. Pt verbalized understanding and had no concerns at the end of the call.

## 2023-02-01 NOTE — Telephone Encounter (Signed)
Inbound call from patient , stated she is expecting a call back from you to follow up.Please advise

## 2023-02-05 ENCOUNTER — Ambulatory Visit (INDEPENDENT_AMBULATORY_CARE_PROVIDER_SITE_OTHER): Payer: Medicare Other

## 2023-02-05 VITALS — Wt 110.0 lb

## 2023-02-05 DIAGNOSIS — Z0000 Encounter for general adult medical examination without abnormal findings: Secondary | ICD-10-CM

## 2023-02-05 NOTE — Progress Notes (Signed)
Subjective:   Robyn Hudson is a 67 y.o. female who presents for Medicare Annual (Subsequent) preventive examination.  I connected with  Campbell Riches on 02/05/23 by a audio enabled telemedicine application and verified that I am speaking with the correct person using two identifiers.  Patient Location: Home  Provider Location: Home Office  I discussed the limitations of evaluation and management by telemedicine. The patient expressed understanding and agreed to proceed.   Review of Systems     Cardiac Risk Factors include: advanced age (>39men, >68 women);dyslipidemia     Objective:    Today's Vitals   02/05/23 1410 02/05/23 1411  Weight: 110 lb (49.9 kg)   PainSc:  0-No pain   Body mass index is 20.78 kg/m.     02/05/2023    2:19 PM 01/27/2023   10:44 AM 02/02/2022    9:26 AM 08/01/2016    3:12 PM  Advanced Directives  Does Patient Have a Medical Advance Directive? Yes Yes Yes Yes  Type of Estate agent of [place];Living will Healthcare Power of [place];Living will Living will;Healthcare Power of Attorney Living will;Healthcare Power of Attorney  Does patient want to make changes to medical advance directive?   No - Patient declined No - Patient declined  Copy of Healthcare Power of Attorney in Chart? No - copy requested Yes - validated most recent copy scanned in chart (See row information) No - copy requested No - copy requested  Would patient like information on creating a medical advance directive?  No - Patient declined      Current Medications (verified) Outpatient Encounter Medications as of 02/05/2023  Medication Sig   Ascorbic Acid (VITAMIN C) 1000 MG tablet Take 1,000 mg by mouth daily.   CALCIUM PO Take 1 tablet by mouth daily at 6 (six) AM.   Ginkgo Biloba 120 MG CAPS Take by mouth.   Glucosamine-Chondroit-Vit C-Mn (GLUCOSAMINE 1500 COMPLEX PO) Take 1,500 mg by mouth 3 (three) times daily.   Multiple Vitamin (MULTIVITAMIN)  tablet Take 1 tablet by mouth 2 (two) times daily.   Na Sulfate-K Sulfate-Mg Sulf 17.5-3.13-1.6 GM/177ML SOLN See admin instructions.   Probiotic Product (PROBIOTIC DAILY PO) Take by mouth daily. Patient takes 25 billion CFU   Turmeric (QC TUMERIC COMPLEX PO) Take 2,250 mg by mouth 3 (three) times daily.   valACYclovir (VALTREX) 500 MG tablet Take 500 mg by mouth 2 (two) times daily.   VITAMIN D, CHOLECALCIFEROL, PO Take 250 mcg by mouth.   Zinc 30 MG CAPS Take by mouth.   phenazopyridine (PYRIDIUM) 95 MG tablet Take 95 mg by mouth 3 (three) times daily as needed for pain. (Patient not taking: Reported on 02/05/2023)   [DISCONTINUED] nitrofurantoin, macrocrystal-monohydrate, (MACROBID) 100 MG capsule Take 1 capsule (100 mg total) by mouth 2 (two) times daily.   No facility-administered encounter medications on file as of 02/05/2023.    Allergies (verified) Sulfonamide derivatives   History: Past Medical History:  Diagnosis Date   Diverticulosis    Esophageal stricture    GERD (gastroesophageal reflux disease)    Hyperlipidemia    elevated due to very high HDL   Osteoporosis    Vitamin D deficiency    Past Surgical History:  Procedure Laterality Date   ABDOMINAL HYSTERECTOMY     USO for dysfunctional menses & endometriosis   COLONOSCOPY  10/23/2010   JP--tics-movi(exc)-37yr recall   TONSILLECTOMY     WISDOM TOOTH EXTRACTION     Family History  Problem Relation  Age of Onset   Dementia Mother        lewy body   Varicose Veins Brother    Peripheral vascular disease Brother    Lung cancer Maternal Grandfather        smoker   Heart failure Paternal Grandmother        in 9s   COPD Paternal Grandfather        smoker   Diabetes Neg Hx    Stroke Neg Hx    Colon polyps Neg Hx    Colon cancer Neg Hx    Esophageal cancer Neg Hx    Rectal cancer Neg Hx    Stomach cancer Neg Hx    Social History   Socioeconomic History   Marital status: Married    Spouse name: Not on file    Number of children: Not on file   Years of education: Not on file   Highest education level: Not on file  Occupational History   Not on file  Tobacco Use   Smoking status: Never   Smokeless tobacco: Never  Vaping Use   Vaping Use: Never used  Substance and Sexual Activity   Alcohol use: No   Drug use: No   Sexual activity: Not Currently  Other Topics Concern   Not on file  Social History Narrative   Not on file   Social Determinants of Health   Financial Resource Strain: Low Risk  (02/05/2023)   Overall Financial Resource Strain (CARDIA)    Difficulty of Paying Living Expenses: Not hard at all  Food Insecurity: No Food Insecurity (02/05/2023)   Hunger Vital Sign    Worried About Running Out of Food in the Last Year: Never true    Ran Out of Food in the Last Year: Never true  Transportation Needs: No Transportation Needs (02/05/2023)   PRAPARE - Administrator, Civil Service (Medical): No    Lack of Transportation (Non-Medical): No  Physical Activity: Sufficiently Active (02/05/2023)   Exercise Vital Sign    Days of Exercise per Week: 5 days    Minutes of Exercise per Session: 60 min  Stress: No Stress Concern Present (02/05/2023)   [employer] of Occupational Health - Occupational Stress Questionnaire    Feeling of Stress : Not at all  Social Connections: Socially Integrated (02/05/2023)   Social Connection and Isolation Panel [NHANES]    Frequency of Communication with Friends and Family: More than three times a week    Frequency of Social Gatherings with Friends and Family: More than three times a week    Attends Religious Services: More than 4 times per year    Active Member of [employer] or Organizations: Yes    Attends Engineer, structural: More than 4 times per year    Marital Status: Married    Tobacco Counseling Counseling given: Not Answered   Clinical Intake:  Pre-visit preparation completed: Yes  Pain : No/denies pain Pain Score:  0-No pain     Nutritional Risks: None Diabetes: No  How often do you need to have someone help you when you read instructions, pamphlets, or other written materials from your doctor or pharmacy?: 1 - Never  Diabetic? no  Interpreter Needed?: No  Information entered by :: Philamena Kramar, LPN   Activities of Daily Living    02/05/2023    2:15 PM  In your present state of health, do you have any difficulty performing the following activities:  Hearing? 0  Vision? 0  Difficulty concentrating or making decisions? 0  Walking or climbing stairs? 0  Dressing or bathing? 0  Doing errands, shopping? 0  Preparing Food and eating ? N  Using the Toilet? N  In the past six months, have you accidently leaked urine? N  Do you have problems with loss of bowel control? N  Managing your Medications? N  Managing your Finances? N  Housekeeping or managing your Housekeeping? N    Patient Care Team: Pincus Sanes, MD as PCP - General (Internal Medicine) Sallye Lat, MD as Consulting Physician (Ophthalmology) Belva Agee, MD as Consulting Physician (Urology) [facility]  Indicate any recent Medical Services you may have received from other than [facility] providers in the past year (date may be approximate).     Assessment:   This is a routine wellness examination for [place].  Hearing/Vision screen Hearing Screening - Comments:: Denies hearing difficulties   Vision Screening - Comments:: Wears rx glasses - up to date with routine eye exams with [facility] and/or [facility] at times  Dietary issues and exercise activities discussed: Current Exercise Habits: Home exercise routine, Type of exercise: walking;strength training/weights, Time (Minutes): 60, Frequency (Times/Week): 5, Weekly Exercise (Minutes/Week): 300, Intensity: Moderate, Exercise limited by: None identified   Goals Addressed             This Visit's Progress    To maintain my current health  status by continuing to eat healthy, stay physically active and socially active.   On track      Depression Screen    02/05/2023    2:14 PM 02/02/2022    9:25 AM 08/17/2021   12:38 PM 08/10/2020    1:22 PM 06/10/2019    8:00 AM 06/06/2018    8:21 AM 06/05/2017    8:12 AM  PHQ 2/9 Scores  PHQ - 2 Score 0 0 0 0 0 0 0  PHQ- 9 Score   0        Fall Risk    02/05/2023    2:12 PM 02/02/2022    9:28 AM 08/17/2021   11:21 AM 08/10/2020    1:21 PM 06/10/2019    8:00 AM  Fall Risk   Falls in the past year? 0 0 0 0 0  Number falls in past yr: 0 0 0 0 0  Injury with Fall? 0 0 0 0   Risk for fall due to : No Fall Risks No Fall Risks No Fall Risks No Fall Risks   Follow up Falls prevention discussed Falls evaluation completed Falls evaluation completed Falls evaluation completed     FALL RISK PREVENTION PERTAINING TO THE HOME:  Any stairs in or around the home? Yes  If so, are there any without handrails? No  Home free of loose throw rugs in walkways, pet beds, electrical cords, etc? Yes  Adequate lighting in your home to reduce risk of falls? Yes   ASSISTIVE DEVICES UTILIZED TO PREVENT FALLS:  Life alert? No  Use of a cane, walker or w/c? No  Grab bars in the bathroom? No  Shower chair or bench in shower? No  Elevated toilet seat or a handicapped toilet? No   TIMED UP AND GO:  Was the test performed? No . Telephonic visit  Cognitive Function:        02/05/2023    2:18 PM 02/02/2022    9:30 AM  6CIT Screen  What Year? 0 points 0 points  What month? 0 points 0 points  What  time? 0 points 0 points  Count back from 20 0 points 0 points  Months in reverse 0 points 0 points  Repeat phrase 0 points 0 points  Total Score 0 points 0 points    Immunizations Immunization History  Administered Date(s) Administered   PFIZER(Purple Top)SARS-COV-2 Vaccination 03/04/2020, 03/29/2020   Tdap 01/22/2013    TDAP status: Due, Education has been provided regarding the importance of  this vaccine. Advised may receive this vaccine at local pharmacy or Health Dept. Aware to provide a copy of the vaccination record if obtained from local pharmacy or Health Dept. Verbalized acceptance and understanding.  Flu Vaccine status: Declined, Education has been provided regarding the importance of this vaccine but patient still declined. Advised may receive this vaccine at local pharmacy or Health Dept. Aware to provide a copy of the vaccination record if obtained from local pharmacy or Health Dept. Verbalized acceptance and understanding.  Pneumococcal vaccine status: Declined,  Education has been provided regarding the importance of this vaccine but patient still declined. Advised may receive this vaccine at local pharmacy or Health Dept. Aware to provide a copy of the vaccination record if obtained from local pharmacy or Health Dept. Verbalized acceptance and understanding.   Covid-19 vaccine status: Declined, Education has been provided regarding the importance of this vaccine but patient still declined. Advised may receive this vaccine at local pharmacy or Health Dept.or vaccine clinic. Aware to provide a copy of the vaccination record if obtained from local pharmacy or Health Dept. Verbalized acceptance and understanding.  Qualifies for Shingles Vaccine? Yes   Zostavax completed No   Shingrix Completed?: No.    Education has been provided regarding the importance of this vaccine. Patient has been advised to call insurance company to determine out of pocket expense if they have not yet received this vaccine. Advised may also receive vaccine at local pharmacy or Health Dept. Verbalized acceptance and understanding.  Screening Tests Health Maintenance  Topic Date Due   Zoster Vaccines- Shingrix (1 of 2) Never done   COVID-19 Vaccine (3 - 2023-24 season) 06/23/2022   DTaP/Tdap/Td (2 - Td or Tdap) 01/23/2023   Pneumonia Vaccine 16+ Years old (1 of 1 - PCV) 08/24/2023 (Originally 01/15/2020)    INFLUENZA VACCINE  05/24/2023   DEXA SCAN  08/19/2023   Medicare Annual Wellness (AWV)  02/05/2024   MAMMOGRAM  08/24/2024   COLONOSCOPY (Pts 45-62yrs Insurance coverage will need to be confirmed)  04/22/2031   Hepatitis C Screening  Completed   HPV VACCINES  Aged Out    Health Maintenance  Health Maintenance Due  Topic Date Due   Zoster Vaccines- Shingrix (1 of 2) Never done   COVID-19 Vaccine (3 - 2023-24 season) 06/23/2022   DTaP/Tdap/Td (2 - Td or Tdap) 01/23/2023    Colorectal cancer screening: Type of screening: Colonoscopy. Completed 04/21/2021. Repeat every 10 years  Mammogram status: Completed 08/24/2022. Repeat every year  Bone Density status: Completed 08/18/2021. Results reflect: Bone density results: OSTEOPOROSIS. Repeat every 2 years.  Lung Cancer Screening: (Low Dose CT Chest recommended if Age 8-80 years, 30 pack-year currently smoking OR have quit w/in 15years.) does not qualify.   Additional Screening:  Hepatitis C Screening: does qualify; Completed 07/26/2016  Vision Screening: Recommended annual ophthalmology exams for early detection of glaucoma and other disorders of the eye. Is the patient up to date with their annual eye exam?  Yes  Who is the provider or what is the name of the office in which the  patient attends annual eye exams? [facility] and Dr Dione Booze If pt is not established with a provider, would they like to be referred to a provider to establish care? No .   Dental Screening: Recommended annual dental exams for proper oral hygiene  Community Resource Referral / Chronic Care Management: CRR required this visit?  No   CCM required this visit?  No      Plan:     I have personally reviewed and noted the following in the patient's chart:   Medical and social history Use of alcohol, tobacco or illicit drugs  Current medications and supplements including opioid prescriptions. Patient is not currently taking opioid  prescriptions. Functional ability and status Nutritional status Physical activity Advanced directives List of other physicians Hospitalizations, surgeries, and ER visits in previous 12 months Vitals Screenings to include cognitive, depression, and falls Referrals and appointments  In addition, I have reviewed and discussed with patient certain preventive protocols, quality metrics, and best practice recommendations. A written personalized care plan for preventive services as well as general preventive health recommendations were provided to patient.     [place] Constable, LPN   1/61/0960   Nurse Notes: None

## 2023-02-05 NOTE — Patient Instructions (Signed)
Robyn Hudson , Thank you for taking time to come for your Medicare Wellness Visit. I appreciate your ongoing commitment to your health goals. Please review the following plan we discussed and let me know if I can assist you in the future.   These are the goals we discussed:  Goals      To maintain my current health status by continuing to eat healthy, stay physically active and socially active.        This is a list of the screening recommended for you and due dates:  Health Maintenance  Topic Date Due   Zoster (Shingles) Vaccine (1 of 2) Never done   COVID-19 Vaccine (3 - 2023-24 season) 06/23/2022   DTaP/Tdap/Td vaccine (2 - Td or Tdap) 01/23/2023   Pneumonia Vaccine (1 of 1 - PCV) 08/24/2023*   Flu Shot  05/24/2023   DEXA scan (bone density measurement)  08/19/2023   Medicare Annual Wellness Visit  02/05/2024   Mammogram  08/24/2024   Colon Cancer Screening  04/22/2031   Hepatitis C Screening: USPSTF Recommendation to screen - Ages 18-79 yo.  Completed   HPV Vaccine  Aged Out  *Topic was postponed. The date shown is not the original due date.    Advanced directives: Please bring a copy of your health care power of attorney and living will to the office to be added to your chart at your convenience.   Conditions/risks identified: Keep up the great work!   Next appointment: Follow up in one year for your annual wellness visit    Preventive Care 65 Years and Older, Female Preventive care refers to lifestyle choices and visits with your health care provider that can promote health and wellness. What does preventive care include? A yearly physical exam. This is also called an annual well check. Dental exams once or twice a year. Routine eye exams. Ask your health care provider how often you should have your eyes checked. Personal lifestyle choices, including: Daily care of your teeth and gums. Regular physical activity. Eating a healthy diet. Avoiding tobacco and drug  use. Limiting alcohol use. Practicing safe sex. Taking low-dose aspirin every day. Taking vitamin and mineral supplements as recommended by your health care provider. What happens during an annual well check? The services and screenings done by your health care provider during your annual well check will depend on your age, overall health, lifestyle risk factors, and family history of disease. Counseling  Your health care provider may ask you questions about your: Alcohol use. Tobacco use. Drug use. Emotional well-being. Home and relationship well-being. Sexual activity. Eating habits. History of falls. Memory and ability to understand (cognition). Work and work Astronomer. Reproductive health. Screening  You may have the following tests or measurements: Height, weight, and BMI. Blood pressure. Lipid and cholesterol levels. These may be checked every 5 years, or more frequently if you are over 47 years old. Skin check. Lung cancer screening. You may have this screening every year starting at age 52 if you have a 30-pack-year history of smoking and currently smoke or have quit within the past 15 years. Fecal occult blood test (FOBT) of the stool. You may have this test every year starting at age 66. Flexible sigmoidoscopy or colonoscopy. You may have a sigmoidoscopy every 5 years or a colonoscopy every 10 years starting at age 86. Hepatitis C blood test. Hepatitis B blood test. Sexually transmitted disease (STD) testing. Diabetes screening. This is done by checking your blood sugar (glucose) after you  have not eaten for a while (fasting). You may have this done every 1-3 years. Bone density scan. This is done to screen for osteoporosis. You may have this done starting at age 75. Mammogram. This may be done every 1-2 years. Talk to your health care provider about how often you should have regular mammograms. Talk with your health care provider about your test results, treatment  options, and if necessary, the need for more tests. Vaccines  Your health care provider may recommend certain vaccines, such as: Influenza vaccine. This is recommended every year. Tetanus, diphtheria, and acellular pertussis (Tdap, Td) vaccine. You may need a Td booster every 10 years. Zoster vaccine. You may need this after age 80. Pneumococcal 13-valent conjugate (PCV13) vaccine. One dose is recommended after age 42. Pneumococcal polysaccharide (PPSV23) vaccine. One dose is recommended after age 61. Talk to your health care provider about which screenings and vaccines you need and how often you need them. This information is not intended to replace advice given to you by your health care provider. Make sure you discuss any questions you have with your health care provider. Document Released: 11/05/2015 Document Revised: 06/28/2016 Document Reviewed: 08/10/2015 Elsevier Interactive Patient Education  2017 [employer] Prevention in the Home Falls can cause injuries. They can happen to people of all ages. There are many things you can do to make your home safe and to help prevent falls. What can I do on the outside of my home? Regularly fix the edges of walkways and driveways and fix any cracks. Remove anything that might make you trip as you walk through a door, such as a raised step or threshold. Trim any bushes or trees on the path to your home. Use bright outdoor lighting. Clear any walking paths of anything that might make someone trip, such as rocks or tools. Regularly check to see if handrails are loose or broken. Make sure that both sides of any steps have handrails. Any raised decks and porches should have guardrails on the edges. Have any leaves, snow, or ice cleared regularly. Use sand or salt on walking paths during winter. Clean up any spills in your garage right away. This includes oil or grease spills. What can I do in the bathroom? Use night lights. Install grab  bars by the toilet and in the tub and shower. Do not use towel bars as grab bars. Use non-skid mats or decals in the tub or shower. If you need to sit down in the shower, use a plastic, non-slip stool. Keep the floor dry. Clean up any water that spills on the floor as soon as it happens. Remove soap buildup in the tub or shower regularly. Attach bath mats securely with double-sided non-slip rug tape. Do not have throw rugs and other things on the floor that can make you trip. What can I do in the bedroom? Use night lights. Make sure that you have a light by your bed that is easy to reach. Do not use any sheets or blankets that are too big for your bed. They should not hang down onto the floor. Have a firm chair that has side arms. You can use this for support while you get dressed. Do not have throw rugs and other things on the floor that can make you trip. What can I do in the kitchen? Clean up any spills right away. Avoid walking on wet floors. Keep items that you use a lot in easy-to-reach places. If you need  to reach something above you, use a strong step stool that has a grab bar. Keep electrical cords out of the way. Do not use floor polish or wax that makes floors slippery. If you must use wax, use non-skid floor wax. Do not have throw rugs and other things on the floor that can make you trip. What can I do with my stairs? Do not leave any items on the stairs. Make sure that there are handrails on both sides of the stairs and use them. Fix handrails that are broken or loose. Make sure that handrails are as long as the stairways. Check any carpeting to make sure that it is firmly attached to the stairs. Fix any carpet that is loose or worn. Avoid having throw rugs at the top or bottom of the stairs. If you do have throw rugs, attach them to the floor with carpet tape. Make sure that you have a light switch at the top of the stairs and the bottom of the stairs. If you do not have them,  ask someone to add them for you. What else can I do to help prevent falls? Wear shoes that: Do not have high heels. Have rubber bottoms. Are comfortable and fit you well. Are closed at the toe. Do not wear sandals. If you use a stepladder: Make sure that it is fully opened. Do not climb a closed stepladder. Make sure that both sides of the stepladder are locked into place. Ask someone to hold it for you, if possible. Clearly mark and make sure that you can see: Any grab bars or handrails. First and last steps. Where the edge of each step is. Use tools that help you move around (mobility aids) if they are needed. These include: Canes. Walkers. Scooters. Crutches. Turn on the lights when you go into a dark area. Replace any light bulbs as soon as they burn out. Set up your furniture so you have a clear path. Avoid moving your furniture around. If any of your floors are uneven, fix them. If there are any pets around you, be aware of where they are. Review your medicines with your doctor. Some medicines can make you feel dizzy. This can increase your chance of falling. Ask your doctor what other things that you can do to help prevent falls. This information is not intended to replace advice given to you by your health care provider. Make sure you discuss any questions you have with your health care provider. Document Released: 08/05/2009 Document Revised: 03/16/2016 Document Reviewed: 11/13/2014 Elsevier Interactive Patient Education  2017 [employer].

## 2023-02-16 ENCOUNTER — Ambulatory Visit: Payer: Medicare Other | Admitting: Obstetrics and Gynecology

## 2023-07-04 ENCOUNTER — Telehealth: Payer: Self-pay | Admitting: Internal Medicine

## 2023-07-04 NOTE — Telephone Encounter (Signed)
Pt called wanting to do a bone density test at [facility] due to an earlier appt please advise.  Best called back number  [phone]

## 2023-07-05 ENCOUNTER — Other Ambulatory Visit: Payer: Self-pay

## 2023-07-05 DIAGNOSIS — M810 Age-related osteoporosis without current pathological fracture: Secondary | ICD-10-CM

## 2023-07-05 NOTE — Telephone Encounter (Signed)
Test ordered for patient.

## 2023-07-09 ENCOUNTER — Other Ambulatory Visit: Payer: Self-pay

## 2023-07-09 DIAGNOSIS — M810 Age-related osteoporosis without current pathological fracture: Secondary | ICD-10-CM

## 2023-08-03 ENCOUNTER — Encounter (HOSPITAL_BASED_OUTPATIENT_CLINIC_OR_DEPARTMENT_OTHER): Payer: Self-pay | Admitting: Emergency Medicine

## 2023-08-03 ENCOUNTER — Other Ambulatory Visit: Payer: Self-pay

## 2023-08-03 ENCOUNTER — Emergency Department (HOSPITAL_BASED_OUTPATIENT_CLINIC_OR_DEPARTMENT_OTHER): Payer: Medicare Other

## 2023-08-03 ENCOUNTER — Emergency Department (HOSPITAL_BASED_OUTPATIENT_CLINIC_OR_DEPARTMENT_OTHER)
Admission: EM | Admit: 2023-08-03 | Discharge: 2023-08-03 | Disposition: A | Discharge status: Home / Self Care | Payer: Medicare Other | Attending: Emergency Medicine | Admitting: Emergency Medicine

## 2023-08-03 ENCOUNTER — Ambulatory Visit
Admission: EM | Admit: 2023-08-03 | Discharge: 2023-08-03 | Disposition: A | Discharge status: Home / Self Care | Payer: Medicare Other

## 2023-08-03 DIAGNOSIS — W01198A Fall on same level from slipping, tripping and stumbling with subsequent striking against other object, initial encounter: Secondary | ICD-10-CM | POA: Insufficient documentation

## 2023-08-03 DIAGNOSIS — M810 Age-related osteoporosis without current pathological fracture: Secondary | ICD-10-CM | POA: Insufficient documentation

## 2023-08-03 DIAGNOSIS — M542 Cervicalgia: Secondary | ICD-10-CM | POA: Diagnosis not present

## 2023-08-03 DIAGNOSIS — W19XXXA Unspecified fall, initial encounter: Secondary | ICD-10-CM

## 2023-08-03 DIAGNOSIS — S0033XA Contusion of nose, initial encounter: Secondary | ICD-10-CM | POA: Insufficient documentation

## 2023-08-03 DIAGNOSIS — Z23 Encounter for immunization: Secondary | ICD-10-CM | POA: Insufficient documentation

## 2023-08-03 DIAGNOSIS — S80211A Abrasion, right knee, initial encounter: Secondary | ICD-10-CM | POA: Diagnosis not present

## 2023-08-03 DIAGNOSIS — S0992XA Unspecified injury of nose, initial encounter: Secondary | ICD-10-CM | POA: Diagnosis present

## 2023-08-03 DIAGNOSIS — S0993XA Unspecified injury of face, initial encounter: Secondary | ICD-10-CM

## 2023-08-03 DIAGNOSIS — Y9301 Activity, walking, marching and hiking: Secondary | ICD-10-CM | POA: Diagnosis not present

## 2023-08-03 DIAGNOSIS — S0081XA Abrasion of other part of head, initial encounter: Secondary | ICD-10-CM | POA: Insufficient documentation

## 2023-08-03 DIAGNOSIS — R519 Headache, unspecified: Secondary | ICD-10-CM | POA: Diagnosis present

## 2023-08-03 DIAGNOSIS — S0990XA Unspecified injury of head, initial encounter: Secondary | ICD-10-CM | POA: Diagnosis present

## 2023-08-03 MED ORDER — TETANUS-DIPHTH-ACELL PERTUSSIS 5-2.5-18.5 LF-MCG/0.5 IM SUSY
0.5000 mL | PREFILLED_SYRINGE | Freq: Once | INTRAMUSCULAR | Status: AC
  Administered 2023-08-03: 0.5 mL via INTRAMUSCULAR
  Filled 2023-08-03: qty 0.5

## 2023-08-03 MED ORDER — IBUPROFEN 400 MG PO TABS
600.0000 mg | ORAL_TABLET | Freq: Once | ORAL | Status: AC
  Administered 2023-08-03: 600 mg via ORAL
  Filled 2023-08-03: qty 1

## 2023-08-03 NOTE — ED Triage Notes (Signed)
"  I fell walking to the mailbox hitting my face down in the ground". "I did not pass out, just stumbled and fell". DOI: "Today" around 3pm. "My knee's may be skint up but no bleeding". Unable to breath well "out of the left side of my nose". "Nose swelling, redness, abrasions". No LOC. No nausea. No vomiting.

## 2023-08-03 NOTE — Discharge Instructions (Addendum)
Thank you for allowing [place] to be a part of your care today.  Your imaging was negative for fracture in your head, face, or neck.    I recommend using ice or cool compresses to your face 3 times per day for no longer than 20 minutes at a time to help with swelling.  Always use a barrier between your face and the ice such as a towel or cloth to prevent skin injury.  I recommend avoiding nose blowing for the next couple of days to prevent recurrence of nosebleed or increase in pain.    Keep your wounds clean.  I recommend using Neosporin or bacitracin to these areas to help with wound healing.  Monitor for signs of infection including redness, swelling, increased warmth, or drainage from the sites.  If you develop signs of infection, seek medical attention immediately.  Follow-up with your primary care doctor as needed.  Return to the ED if you develop sudden worsening of your symptoms or if you have any new concerns.

## 2023-08-03 NOTE — ED Notes (Signed)
Patient resting quietly in stretcher, respirations even, unlabored, no acute distress noted. Denies needs at this time. Visitor at bedside.

## 2023-08-03 NOTE — ED Provider Notes (Signed)
[place] EMERGENCY DEPARTMENT AT [facility] Provider Note   CSN: [id number] Arrival date & time: 08/03/23  1308     History  Chief Complaint  Patient presents with   Robyn Hudson    Robyn Hudson is a 67 y.o. female with past medical history significant for vitamin D deficiency, osteoporosis, GERD, hyperlipidemia presents to the ED with facial injury secondary to mechanical fall.  Patient states she was walking to the trash can when she tripped over her own feet and fell forward onto her face.  She states she had a headache and has injuries to her face and nose.  She took ibuprofen around 3:30 PM with improvement in her symptoms.  She states she also has a small abrasion to her right knee, but is denying any significant pain.  Patient initially had some epistaxis, but this resolved prior to ED arrival.  Endorses neck pain.  She reports that it is difficult to breathe out of her left nostril.  Denies blood thinner use.  Denies loss of consciousness, vomiting, nausea, visual disturbance.      Home Medications Prior to Admission medications   Medication Sig Start Date End Date Taking? Authorizing Provider  Ascorbic Acid (VITAMIN C) 1000 MG tablet Take 1,000 mg by mouth daily.    [provider]  CALCIUM PO Take 1 tablet by mouth daily at 6 (six) AM.    [provider]  Ginkgo Biloba 120 MG CAPS Take by mouth.    [provider]  Glucosamine-Chondroit-Vit C-Mn (GLUCOSAMINE 1500 COMPLEX PO) Take 1,500 mg by mouth 3 (three) times daily.    [provider]  Multiple Vitamin (MULTIVITAMIN) tablet Take 1 tablet by mouth 2 (two) times daily.    [provider]  Na Sulfate-K Sulfate-Mg Sulf 17.5-3.13-1.6 GM/177ML SOLN See admin instructions. 11/20/22   [provider]  phenazopyridine (PYRIDIUM) 95 MG tablet Take 95 mg by mouth 3 (three) times daily as needed for pain. Patient not taking: Reported on 02/05/2023    [provider]  Probiotic Product (PROBIOTIC DAILY PO) Take by mouth daily. Patient takes 25 billion CFU    [provider]  Turmeric (QC TUMERIC COMPLEX PO) Take 2,250 mg by mouth 3 (three) times daily.    [provider]  valACYclovir (VALTREX) 500 MG tablet Take 500 mg by mouth 2 (two) times daily. 06/30/21   [provider]  VITAMIN D, CHOLECALCIFEROL, PO Take 250 mcg by mouth.    [provider]  Zinc 30 MG CAPS Take by mouth.    [provider]      Allergies    Sulfonamide derivatives    Review of Systems   Review of Systems  HENT:  Positive for nosebleeds.   Musculoskeletal:  Positive for neck pain. Negative for back pain.  Skin:  Positive for wound.  Neurological:  Positive for headaches. Negative for syncope.    Physical Exam Updated Vital Signs BP 114/68   Pulse 75   Temp 98.2 F (36.8 C)   Resp 20   SpO2 100%  Physical Exam Vitals and nursing note reviewed.  Constitutional:      General: She is not in acute distress.    Appearance: Normal appearance. She is not ill-appearing or diaphoretic.  HENT:     Head: Normocephalic. Abrasion and contusion (nasal bridge) present. No raccoon eyes or laceration.     Jaw: There is normal jaw occlusion.      Mouth/Throat:  Lips: Pink.     Mouth: Mucous membranes are moist. No injury or lacerations.  Cardiovascular:     Rate and Rhythm: Normal rate and regular rhythm.  Pulmonary:     Effort: Pulmonary effort is normal.  Musculoskeletal:     Cervical back: Normal range of motion and neck supple. Muscular tenderness present. No spinous process tenderness.  Skin:    General: Skin is warm and dry.     Capillary Refill: Capillary refill takes less than 2 seconds.  Neurological:     Mental Status: She is alert. Mental status is at baseline.  Psychiatric:        Mood and Affect: Mood normal.        Behavior: Behavior normal.     ED Results / Procedures / Treatments   Labs (all labs  ordered are listed, but only abnormal results are displayed) Labs Reviewed - No data to display  EKG None  Radiology CT Head Wo Contrast  Result Date: 08/03/2023 CLINICAL DATA:  Fall.  Head trauma. EXAM: CT HEAD WITHOUT CONTRAST CT MAXILLOFACIAL WITHOUT CONTRAST CT CERVICAL SPINE WITHOUT CONTRAST TECHNIQUE: Multidetector CT imaging of the head, cervical spine, and maxillofacial structures were performed using the standard protocol without intravenous contrast. Multiplanar CT image reconstructions of the cervical spine and maxillofacial structures were also generated. RADIATION DOSE REDUCTION: This exam was performed according to the departmental dose-optimization program which includes automated exposure control, adjustment of the mA and/or kV according to patient size and/or use of iterative reconstruction technique. COMPARISON:  None Available. FINDINGS: CT HEAD FINDINGS Brain: No evidence of acute infarction, hemorrhage, hydrocephalus, extra-axial collection or mass lesion/mass effect. Vascular: Atherosclerotic calcifications are present within the cavernous internal carotid arteries. Skull: Normal. Negative for fracture or focal lesion. Other: None. CT MAXILLOFACIAL FINDINGS Osseous: No fracture or mandibular dislocation. No destructive process. There is chronic nasal septal deviation to the right. Orbits: Negative. No traumatic or inflammatory finding. Sinuses: Clear. Soft tissues: There is soft tissue swelling over the bridge of the nose. No foreign body. CT CERVICAL SPINE FINDINGS Alignment: Normal. Skull base and vertebrae: No acute fracture. No primary bone lesion or focal pathologic process. Soft tissues and spinal canal: No prevertebral fluid or swelling. No visible canal hematoma. Disc levels: There is mild disc space narrowing throughout the cervical spine. There is no significant central canal or neural foraminal stenosis at any level. Upper chest: There are calcified bilateral pleural  plaques. Other: None. IMPRESSION: 1. No acute intracranial process. 2. Soft tissue swelling over the bridge of the nose. No acute facial fracture. 3. No acute fracture or traumatic subluxation of the cervical spine. Electronically Signed   By: Darliss Cheney M.D.   On: 08/03/2023 22:37   CT Maxillofacial Wo Contrast  Result Date: 08/03/2023 CLINICAL DATA:  Fall.  Head trauma. EXAM: CT HEAD WITHOUT CONTRAST CT MAXILLOFACIAL WITHOUT CONTRAST CT CERVICAL SPINE WITHOUT CONTRAST TECHNIQUE: Multidetector CT imaging of the head, cervical spine, and maxillofacial structures were performed using the standard protocol without intravenous contrast. Multiplanar CT image reconstructions of the cervical spine and maxillofacial structures were also generated. RADIATION DOSE REDUCTION: This exam was performed according to the departmental dose-optimization program which includes automated exposure control, adjustment of the mA and/or kV according to patient size and/or use of iterative reconstruction technique. COMPARISON:  None Available. FINDINGS: CT HEAD FINDINGS Brain: No evidence of acute infarction, hemorrhage, hydrocephalus, extra-axial collection or mass lesion/mass effect. Vascular: Atherosclerotic calcifications are present within the cavernous internal carotid arteries. Skull:  Normal. Negative for fracture or focal lesion. Other: None. CT MAXILLOFACIAL FINDINGS Osseous: No fracture or mandibular dislocation. No destructive process. There is chronic nasal septal deviation to the right. Orbits: Negative. No traumatic or inflammatory finding. Sinuses: Clear. Soft tissues: There is soft tissue swelling over the bridge of the nose. No foreign body. CT CERVICAL SPINE FINDINGS Alignment: Normal. Skull base and vertebrae: No acute fracture. No primary bone lesion or focal pathologic process. Soft tissues and spinal canal: No prevertebral fluid or swelling. No visible canal hematoma. Disc levels: There is mild disc space  narrowing throughout the cervical spine. There is no significant central canal or neural foraminal stenosis at any level. Upper chest: There are calcified bilateral pleural plaques. Other: None. IMPRESSION: 1. No acute intracranial process. 2. Soft tissue swelling over the bridge of the nose. No acute facial fracture. 3. No acute fracture or traumatic subluxation of the cervical spine. Electronically Signed   By: Darliss Cheney M.D.   On: 08/03/2023 22:37   CT Cervical Spine Wo Contrast  Result Date: 08/03/2023 CLINICAL DATA:  Fall.  Head trauma. EXAM: CT HEAD WITHOUT CONTRAST CT MAXILLOFACIAL WITHOUT CONTRAST CT CERVICAL SPINE WITHOUT CONTRAST TECHNIQUE: Multidetector CT imaging of the head, cervical spine, and maxillofacial structures were performed using the standard protocol without intravenous contrast. Multiplanar CT image reconstructions of the cervical spine and maxillofacial structures were also generated. RADIATION DOSE REDUCTION: This exam was performed according to the departmental dose-optimization program which includes automated exposure control, adjustment of the mA and/or kV according to patient size and/or use of iterative reconstruction technique. COMPARISON:  None Available. FINDINGS: CT HEAD FINDINGS Brain: No evidence of acute infarction, hemorrhage, hydrocephalus, extra-axial collection or mass lesion/mass effect. Vascular: Atherosclerotic calcifications are present within the cavernous internal carotid arteries. Skull: Normal. Negative for fracture or focal lesion. Other: None. CT MAXILLOFACIAL FINDINGS Osseous: No fracture or mandibular dislocation. No destructive process. There is chronic nasal septal deviation to the right. Orbits: Negative. No traumatic or inflammatory finding. Sinuses: Clear. Soft tissues: There is soft tissue swelling over the bridge of the nose. No foreign body. CT CERVICAL SPINE FINDINGS Alignment: Normal. Skull base and vertebrae: No acute fracture. No primary  bone lesion or focal pathologic process. Soft tissues and spinal canal: No prevertebral fluid or swelling. No visible canal hematoma. Disc levels: There is mild disc space narrowing throughout the cervical spine. There is no significant central canal or neural foraminal stenosis at any level. Upper chest: There are calcified bilateral pleural plaques. Other: None. IMPRESSION: 1. No acute intracranial process. 2. Soft tissue swelling over the bridge of the nose. No acute facial fracture. 3. No acute fracture or traumatic subluxation of the cervical spine. Electronically Signed   By: Darliss Cheney M.D.   On: 08/03/2023 22:37    Procedures Procedures    Medications Ordered in ED Medications  ibuprofen (ADVIL) tablet 600 mg (600 mg Oral Given 08/03/23 2131)  Tdap (BOOSTRIX) injection 0.5 mL (0.5 mLs Intramuscular Given 08/03/23 2131)    ED Course/ Medical Decision Making/ A&P                                 Medical Decision Making Amount and/or Complexity of Data Reviewed Radiology: ordered.  Risk Prescription drug management.   This patient presents to the ED with chief complaint(s) of facial injury with pertinent past medical history of osteoporosis.  The complaint involves an extensive differential diagnosis  and also carries with it a high risk of complications and morbidity.    The differential diagnosis includes facial fracture, cervical fracture or subluxation, acute intracranial injury   The initial plan is to obtain CT head, cervical spine, maxillofacial  Initial Assessment:   Exam significant for overall well-appearing patient is not in acute distress.  There are abrasions to the face, specifically the nose and philtrum.  No active bleeding.  Dried blood to the right nare, no active bleeding.  Swelling and contusion over the nasal bridge.  No periorbital ecchymosis or swelling.  No dental injury.   Independent visualization and interpretation of imaging: I independently  visualized the following imaging with scope of interpretation limited to determining acute life threatening conditions related to emergency care: CT head, maxillofacial, and cervical spine, which revealed no acute fracture or other injury.  Treatment and Reassessment: Patient given 600 mg of ibuprofen with improvement in pain.  Patient is tetanus shot was also updated today.  Disposition:   Discussed continued wound care with patient.  Recommended ice and ibuprofen to help with pain and swelling of the face.  Advised patient to avoid nose pulling for the next couple of days to prevent recurring epistaxis or increased pain to the nose.  The patient has been appropriately medically screened and/or stabilized in the ED. I have low suspicion for any other emergent medical condition which would require further screening, evaluation or treatment in the ED or require inpatient management. At time of discharge the patient is hemodynamically stable and in no acute distress. I have discussed work-up results and diagnosis with patient and answered all questions. Patient is agreeable with discharge plan. We discussed strict return precautions for returning to the emergency department and they verbalized understanding.           Final Clinical Impression(s) / ED Diagnoses Final diagnoses:  Injury due to fall, initial encounter  Abrasion of face, initial encounter  Contusion of nose, initial encounter    Rx / DC Orders ED Discharge Orders     None         Lenard Simmer, PA-C 08/03/23 2309    Terrilee Files, MD 08/04/23 1050

## 2023-08-03 NOTE — ED Provider Notes (Signed)
Patient presents to urgent care after facial injury that occurred abut an hour ago when she fell walking to her mailbox. She fell directly onto her face. She has since had trouble breathing through the left side of her nose. Recommended she report to ED for CT scan. She does deny LOC and has not had any nausea or vomiting. Husband will transport her POV to ED.    Tomi Bamberger, PA-C 08/03/23 [phone]

## 2023-08-03 NOTE — ED Notes (Signed)
Patient ambulatory to restroom with steady gait.

## 2023-08-03 NOTE — ED Notes (Signed)
Facial wounds cleaned with wound cleaner and gauze, pt tolerated fairly.

## 2023-08-03 NOTE — ED Notes (Signed)
Reviewed AVS with patient, patient expressed understanding of directions, denies further questions at this time. 

## 2023-08-03 NOTE — ED Triage Notes (Signed)
Fall at 3:15pm Seen at [facility] send for eval  Notes per UC  "I fell walking to the mailbox hitting my face down in the ground". "I did not pass out, just stumbled and fell". DOI: "Today" around 3pm. "My knee's may be skint up but no bleeding". Unable to breath well "out of the left side of my nose". "Nose swelling, redness, abrasions". No LOC. No nausea. No vomiting.    Took ibuprofen at 3:30pm  Needs tetanus

## 2023-08-03 NOTE — ED Notes (Signed)
Patient is being discharged from the Urgent Care and sent to the Emergency Department via private vehicle . Per R.Lenise Arena PA, patient is in need of higher level of care due to fall/hit head. Patient is aware and verbalizes understanding of plan of care.  Vitals:   08/03/23 1550 08/03/23 1557  BP: (!) 145/83 127/79  Pulse: 80 80  Resp: 18   Temp: 97.8 F (36.6 C)   SpO2: 98%

## 2023-08-13 ENCOUNTER — Ambulatory Visit (INDEPENDENT_AMBULATORY_CARE_PROVIDER_SITE_OTHER): Payer: Medicare Other | Admitting: Otolaryngology

## 2023-08-13 ENCOUNTER — Encounter (INDEPENDENT_AMBULATORY_CARE_PROVIDER_SITE_OTHER): Payer: Self-pay

## 2023-08-13 VITALS — Ht 61.0 in | Wt 110.0 lb

## 2023-08-13 DIAGNOSIS — S0993XA Unspecified injury of face, initial encounter: Secondary | ICD-10-CM | POA: Diagnosis not present

## 2023-08-13 DIAGNOSIS — W19XXXA Unspecified fall, initial encounter: Secondary | ICD-10-CM | POA: Diagnosis not present

## 2023-08-13 DIAGNOSIS — R040 Epistaxis: Secondary | ICD-10-CM | POA: Diagnosis not present

## 2023-08-13 NOTE — Progress Notes (Signed)
Dear Dr. Lawerance Bach, Here is my assessment for our mutual patient, Robyn Hudson. Thank you for allowing me the opportunity to care for your patient. Please do not hesitate to contact me should you have any other questions. Sincerely, Dr. Jovita Kussmaul  Otolaryngology Clinic Note Referring provider: Dr. Lawerance Bach HPI:  Robyn Hudson is a 67 y.o. female kindly referred for evaluation of facial injuries after fall. Tripped on her own feet and fell on face on 08/03/23  Patient currently reports no significant nasal symptoms; reports she is doing well overall Patient additionally denies:  - other lacerations, malocclusion, teeth instability, trismus - enophthalmos, hypoglobus, vision loss or change,  - significant facial deformity - trouble chewing or swallowing - epistaxis, hearing loss after trauma, nasal obstruction - otorrhea, vertigo.    H&N Surgery: denies Personal or FHx of bleeding dz or anesthesia difficulty: no   Independent Review of Additional Tests or Records:  ED notes reviewed and summarized CT Head and CT Face independently reviewed (08/03/23) showing:  Agree with findings; no obvious facial fracture noted; right septal deviation; some gas around left parasymphyseal gingiva  PMH/Meds/All/SocHx/FamHx/ROS:   Past Medical History:  Diagnosis Date   Diverticulosis    Esophageal stricture    GERD (gastroesophageal reflux disease)    Hyperlipidemia    elevated due to very high HDL   Osteoporosis    Vitamin D deficiency      Past Surgical History:  Procedure Laterality Date   ABDOMINAL HYSTERECTOMY     USO for dysfunctional menses & endometriosis   COLONOSCOPY  10/23/2010   JP--tics-movi(exc)-13yr recall   TONSILLECTOMY     WISDOM TOOTH EXTRACTION      Family History  Problem Relation Age of Onset   Dementia Mother        lewy body   Varicose Veins Brother    Peripheral vascular disease Brother    Lung cancer Maternal Grandfather        smoker   Heart  failure Paternal Grandmother        in 30s   COPD Paternal Grandfather        smoker   Diabetes Neg Hx    Stroke Neg Hx    Colon polyps Neg Hx    Colon cancer Neg Hx    Esophageal cancer Neg Hx    Rectal cancer Neg Hx    Stomach cancer Neg Hx      Social Connections: Socially Integrated (02/05/2023)   Social Connection and Isolation Panel [NHANES]    Frequency of Communication with Friends and Family: More than three times a week    Frequency of Social Gatherings with Friends and Family: More than three times a week    Attends Religious Services: More than 4 times per year    Active Member of [employer] or Organizations: Yes    Attends Engineer, structural: More than 4 times per year    Marital Status: Married      Current Outpatient Medications:    Ascorbic Acid (VITAMIN C) 1000 MG tablet, Take 1,000 mg by mouth daily., Disp: , Rfl:    CALCIUM PO, Take 1 tablet by mouth daily at 6 (six) AM., Disp: , Rfl:    Ginkgo Biloba 120 MG CAPS, Take by mouth., Disp: , Rfl:    Glucosamine-Chondroit-Vit C-Mn (GLUCOSAMINE 1500 COMPLEX PO), Take 1,500 mg by mouth 3 (three) times daily., Disp: , Rfl:    Probiotic Product (PROBIOTIC DAILY PO), Take by mouth daily. Patient takes 25 billion CFU,  Disp: , Rfl:    Turmeric (QC TUMERIC COMPLEX PO), Take 2,250 mg by mouth 3 (three) times daily., Disp: , Rfl:    valACYclovir (VALTREX) 500 MG tablet, Take 500 mg by mouth 2 (two) times daily., Disp: , Rfl:    VITAMIN D, CHOLECALCIFEROL, PO, Take 250 mcg by mouth., Disp: , Rfl:    Zinc 30 MG CAPS, Take by mouth., Disp: , Rfl:    Multiple Vitamin (MULTIVITAMIN) tablet, Take 1 tablet by mouth 2 (two) times daily., Disp: , Rfl:    Na Sulfate-K Sulfate-Mg Sulf 17.5-3.13-1.6 GM/177ML SOLN, See admin instructions., Disp: , Rfl:    phenazopyridine (PYRIDIUM) 95 MG tablet, Take 95 mg by mouth 3 (three) times daily as needed for pain. (Patient not taking: Reported on 02/05/2023), Disp: , Rfl:    Physical Exam:    Ht 5\' 1"  (1.549 m)   Wt 110 lb (49.9 kg)   BMI 20.78 kg/m    Salient findings:  CN II-XII intact, EOM intact  Bilateral EAC clear and TM intact with well pneumatized middle ear spaces Anterior rhinoscopy: Septum deviates right; given epistaxis, will perform nasal endoscopy; bilateral inferior turbinates without significant hypertrophy; no nasal septal hematoma; no significant nasal bone stepoffs  Resolving nasal bone ecchymosis, and forehead ecchymosis; no lacerations No lesions of oral cavity/oropharynx; dentition intact, no stepoffs or lacerations No obviously palpable neck masses/lymphadenopathy/thyromegaly No midface stepoffs No respiratory distress or stridor  Procedures:  PROCEDURE: Bilateral Diagnostic Rigid Nasal Endoscopy Pre-procedure diagnosis: Epistaxis; nasal trauma Post-procedure diagnosis: same Indication: See pre-procedure diagnosis and physical exam above Complications: None apparent EBL: 0 mL Anesthesia: Lidocaine 4% and topical decongestant was topically sprayed in each nasal cavity  Description of Procedure:  Patient was identified. A rigid 0 degree endoscope was utilized to evaluate the sinonasal cavities, mucosa, sinus ostia and turbinates and septum.  Overall, mild signs of mucosal inflammation are noted. No septal hematoma.  No mucopurulence, polyps, or masses noted.   Right Middle meatus: clear Right SE Recess: clear Left MM: clear Left SE Recess: clear  No active epistaxis  Photodocumentation was obtained.  CPT CODE -- 78295 - Mod 25   Impression & Plans:  Robyn Hudson is a 67 y.o. female with Nasal bone trauma Facial trauma Epistaxis No significant cosmetic deformity or bony stepoffs today; we discussed epistaxis precautions, and nasal endoscopy does not show any other lesions - Ocean spray 0.65% q4h PRN   - f/u prn   Thank you for allowing me the opportunity to care for your patient. Please do not hesitate to contact me should you have  any other questions.  Sincerely, Jovita Kussmaul, MD Otolarynoglogist (ENT), [facility] Phone: [phone] Fax: [phone]  08/13/2023, 5:54 PM

## 2023-08-26 ENCOUNTER — Encounter: Payer: Self-pay | Admitting: Internal Medicine

## 2023-08-26 NOTE — Patient Instructions (Addendum)
      Blood work was ordered.   The lab is on the first floor.    Medications changes include :   none      Return in about 1 year (around 08/26/2024) for follow up.

## 2023-08-26 NOTE — Progress Notes (Unsigned)
Subjective:    Patient ID: Robyn Hudson, female    DOB: 1955-11-23, 67 y.o.   MRN: [id number]     HPI Berdina is here for follow up of her chronic medical problems.  She fell - stumbled going to the mailbox - probably it was the shoes that did it - she did throw the shoes away.    Walks 2 miles 3-4 times a week and does weights 5 days a week   Sees gyn today, has dexa and mammo today.   Medications and allergies reviewed with patient and updated if appropriate.  Current Outpatient Medications on File Prior to Visit  Medication Sig Dispense Refill   Ascorbic Acid (VITAMIN C) 1000 MG tablet Take 1,000 mg by mouth daily.     CALCIUM PO Take 1 tablet by mouth daily at 6 (six) AM.     Ginkgo Biloba 120 MG CAPS Take by mouth.     Glucosamine-Chondroit-Vit C-Mn (GLUCOSAMINE 1500 COMPLEX PO) Take 1,500 mg by mouth 3 (three) times daily.     Multiple Vitamin (MULTIVITAMIN) tablet Take 1 tablet by mouth 2 (two) times daily.     Na Sulfate-K Sulfate-Mg Sulf 17.5-3.13-1.6 GM/177ML SOLN See admin instructions.     Probiotic Product (PROBIOTIC DAILY PO) Take by mouth daily. Patient takes 25 billion CFU     Turmeric (QC TUMERIC COMPLEX PO) Take 2,250 mg by mouth 3 (three) times daily.     valACYclovir (VALTREX) 500 MG tablet Take 500 mg by mouth 2 (two) times daily.     VITAMIN D, CHOLECALCIFEROL, PO Take 250 mcg by mouth.     Zinc 30 MG CAPS Take by mouth.     phenazopyridine (PYRIDIUM) 95 MG tablet Take 95 mg by mouth 3 (three) times daily as needed for pain. (Patient not taking: Reported on 02/05/2023)     No current facility-administered medications on file prior to visit.     Review of Systems  Constitutional:  Negative for fever.  Respiratory:  Negative for cough, shortness of breath and wheezing.   Cardiovascular:  Negative for chest pain, palpitations and leg swelling.  Gastrointestinal:  Negative for abdominal pain, blood in stool, constipation and diarrhea.        No gerd  Genitourinary:        Urine is bright yellow  Musculoskeletal:  Negative for arthralgias and back pain.  Neurological:  Negative for light-headedness and headaches.  Psychiatric/Behavioral:  Negative for dysphoric mood. The patient is not nervous/anxious.        Objective:   Vitals:   08/27/23 0901  BP: 112/78  Pulse: 77  Temp: 98 F (36.7 C)  SpO2: 97%   BP Readings from Last 3 Encounters:  08/27/23 112/78  08/03/23 114/68  08/03/23 127/79   Wt Readings from Last 3 Encounters:  08/27/23 110 lb (49.9 kg)  08/13/23 110 lb (49.9 kg)  08/03/23 110 lb (49.9 kg)   Body mass index is 20.78 kg/m.    Physical Exam Constitutional:      General: She is not in acute distress.    Appearance: Normal appearance.  HENT:     Head: Normocephalic and atraumatic.  Eyes:     Conjunctiva/sclera: Conjunctivae normal.  Cardiovascular:     Rate and Rhythm: Normal rate and regular rhythm.     Heart sounds: Normal heart sounds.  Pulmonary:     Effort: Pulmonary effort is normal. No respiratory distress.     Breath sounds: Normal breath  sounds. No wheezing.  Abdominal:     General: There is no distension.     Palpations: Abdomen is soft.     Tenderness: There is no abdominal tenderness. There is no guarding or rebound.  Musculoskeletal:     Cervical back: Neck supple.     Right lower leg: No edema.     Left lower leg: No edema.  Lymphadenopathy:     Cervical: No cervical adenopathy.  Skin:    General: Skin is warm and dry.     Findings: No rash.  Neurological:     Mental Status: She is alert. Mental status is at baseline.  Psychiatric:        Mood and Affect: Mood normal.        Behavior: Behavior normal.        Lab Results  Component Value Date   WBC 7.4 08/23/2022   HGB 13.1 08/23/2022   HCT 39.3 08/23/2022   PLT 359.0 08/23/2022   GLUCOSE 83 08/23/2022   CHOL 235 (H) 08/23/2022   TRIG 45.0 08/23/2022   HDL 82.70 08/23/2022   LDLDIRECT 124.2 01/22/2013    LDLCALC 143 (H) 08/23/2022   ALT 15 08/23/2022   AST 21 08/23/2022   NA 137 08/23/2022   K 3.8 08/23/2022   CL 101 08/23/2022   CREATININE 0.59 08/23/2022   BUN 17 08/23/2022   CO2 27 08/23/2022   TSH 1.25 08/23/2022     Assessment & Plan:    See Problem List for Assessment and Plan of chronic medical problems.

## 2023-08-27 ENCOUNTER — Telehealth: Payer: Self-pay

## 2023-08-27 ENCOUNTER — Ambulatory Visit (INDEPENDENT_AMBULATORY_CARE_PROVIDER_SITE_OTHER): Payer: Medicare Other | Admitting: Internal Medicine

## 2023-08-27 ENCOUNTER — Ambulatory Visit (INDEPENDENT_AMBULATORY_CARE_PROVIDER_SITE_OTHER)
Admission: RE | Admit: 2023-08-27 | Discharge: 2023-08-27 | Disposition: A | Discharge status: Home / Self Care | Payer: Medicare Other | Source: Ambulatory Visit | Attending: Internal Medicine | Admitting: Internal Medicine

## 2023-08-27 VITALS — BP 112/78 | HR 77 | Temp 98.0°F | Ht 61.0 in | Wt 110.0 lb

## 2023-08-27 DIAGNOSIS — E559 Vitamin D deficiency, unspecified: Secondary | ICD-10-CM

## 2023-08-27 DIAGNOSIS — M810 Age-related osteoporosis without current pathological fracture: Secondary | ICD-10-CM

## 2023-08-27 DIAGNOSIS — E782 Mixed hyperlipidemia: Secondary | ICD-10-CM | POA: Diagnosis not present

## 2023-08-27 DIAGNOSIS — K219 Gastro-esophageal reflux disease without esophagitis: Secondary | ICD-10-CM

## 2023-08-27 LAB — VITAMIN D 25 HYDROXY (VIT D DEFICIENCY, FRACTURES): VITD: 111.81 ng/mL (ref 30.00–100.00)

## 2023-08-27 LAB — COMPREHENSIVE METABOLIC PANEL
ALT: 19 U/L (ref 0–35)
AST: 23 U/L (ref 0–37)
Albumin: 4.5 g/dL (ref 3.5–5.2)
Alkaline Phosphatase: 61 U/L (ref 39–117)
BUN: 14 mg/dL (ref 6–23)
CO2: 28 meq/L (ref 19–32)
Calcium: 10 mg/dL (ref 8.4–10.5)
Chloride: 103 meq/L (ref 96–112)
Creatinine, Ser: 0.59 mg/dL (ref 0.40–1.20)
GFR: 92.48 mL/min (ref >60.00)
Glucose, Bld: 85 mg/dL (ref 70–99)
Potassium: 4.5 meq/L (ref 3.5–5.1)
Sodium: 140 meq/L (ref 135–145)
Total Bilirubin: 0.5 mg/dL (ref 0.2–1.2)
Total Protein: 7.4 g/dL (ref 6.0–8.3)

## 2023-08-27 LAB — CBC WITH DIFFERENTIAL/PLATELET
Basophils Absolute: 0.1 10*3/uL (ref 0.0–0.1)
Basophils Relative: 0.6 % (ref 0.0–3.0)
Eosinophils Absolute: 0.1 10*3/uL (ref 0.0–0.7)
Eosinophils Relative: 1.6 % (ref 0.0–5.0)
HCT: 40.7 % (ref 36.0–46.0)
Hemoglobin: 13.2 g/dL (ref 12.0–15.0)
Lymphocytes Relative: 25.7 % (ref 12.0–46.0)
Lymphs Abs: 2.2 10*3/uL (ref 0.7–4.0)
MCHC: 32.5 g/dL (ref 30.0–36.0)
MCV: 93.2 fL (ref 78.0–100.0)
Monocytes Absolute: 0.8 10*3/uL (ref 0.1–1.0)
Monocytes Relative: 8.7 % (ref 3.0–12.0)
Neutro Abs: 5.5 10*3/uL (ref 1.4–7.7)
Neutrophils Relative %: 63.4 % (ref 43.0–77.0)
Platelets: 366 10*3/uL (ref 150.0–400.0)
RBC: 4.37 Mil/uL (ref 3.87–5.11)
RDW: 13.9 % (ref 11.5–15.5)
WBC: 8.7 10*3/uL (ref 4.0–10.5)

## 2023-08-27 LAB — LIPID PANEL
Cholesterol: 220 mg/dL — ABNORMAL HIGH (ref 0–200)
HDL: 80.1 mg/dL (ref >39.00)
LDL Cholesterol: 125 mg/dL — ABNORMAL HIGH (ref 0–99)
NonHDL: 140.36
Total CHOL/HDL Ratio: 3
Triglycerides: 75 mg/dL (ref 0.0–149.0)
VLDL: 15 mg/dL (ref 0.0–40.0)

## 2023-08-27 LAB — HM MAMMOGRAPHY

## 2023-08-27 NOTE — Telephone Encounter (Signed)
CRITICAL VALUE STICKER  CRITICAL VALUE: Vitamin D level at 111.81  RECEIVER (on-site recipient of call): Delorise Shiner  DATE & TIME NOTIFIED: 08/27/23 @ 2:22 pm  MESSENGER (representative from lab): Leah  MD NOTIFIED:  mychart   TIME OF NOTIFICATION: 2:30 pm

## 2023-08-27 NOTE — Assessment & Plan Note (Signed)
Chronic dexa due - getting it today She does not want to take medication Has increased her exercise and is taking her vitamins daily Continue calcium and vitamin d, K2-taking bone up Check vitamin d level Continue regular exercise-walking, doing extremity resistance

## 2023-08-27 NOTE — Telephone Encounter (Signed)
noted 

## 2023-08-27 NOTE — Assessment & Plan Note (Signed)
Chronic Regular exercise and healthy diet encouraged Check lipid panel, CMP, TSH Continue lifestyle control Low ASCVD risk

## 2023-08-27 NOTE — Assessment & Plan Note (Signed)
Chronic Taking vitamin D daily Check vitamin D level  

## 2023-08-28 ENCOUNTER — Encounter: Payer: Self-pay | Admitting: Internal Medicine

## 2023-08-28 DIAGNOSIS — E559 Vitamin D deficiency, unspecified: Secondary | ICD-10-CM

## 2023-11-27 ENCOUNTER — Other Ambulatory Visit (INDEPENDENT_AMBULATORY_CARE_PROVIDER_SITE_OTHER): Payer: Medicare Other

## 2023-11-27 ENCOUNTER — Encounter: Payer: Self-pay | Admitting: Internal Medicine

## 2023-11-27 DIAGNOSIS — E559 Vitamin D deficiency, unspecified: Secondary | ICD-10-CM | POA: Diagnosis not present

## 2023-11-27 LAB — VITAMIN D 25 HYDROXY (VIT D DEFICIENCY, FRACTURES): VITD: 69.38 ng/mL (ref 30.00–100.00)

## 2024-02-06 ENCOUNTER — Ambulatory Visit: Payer: Medicare Other

## 2024-02-06 VITALS — Ht 61.0 in | Wt 110.0 lb

## 2024-02-06 DIAGNOSIS — Z0000 Encounter for general adult medical examination without abnormal findings: Secondary | ICD-10-CM | POA: Diagnosis not present

## 2024-02-06 NOTE — Progress Notes (Signed)
 Subjective:   Robyn Hudson is a 68 y.o. who presents for a Medicare Wellness preventive visit.  Visit Complete: Virtual I connected with  Campbell Riches on 02/06/24 by a audio enabled telemedicine application and verified that I am speaking with the correct person using two identifiers.  Patient Location: Home  Provider Location: Office/Clinic  I discussed the limitations of evaluation and management by telemedicine. The patient expressed understanding and agreed to proceed.  Vital Signs: Because this visit was a virtual/telehealth visit, some criteria may be missing or patient reported. Any vitals not documented were not able to be obtained and vitals that have been documented are patient reported.  VideoDeclined- This patient declined Librarian, academic. Therefore the visit was completed with audio only.  Persons Participating in Visit: Patient.  AWV Questionnaire: No: Patient Medicare AWV questionnaire was not completed prior to this visit.  Cardiac Risk Factors include: advanced age (>93men, >41 women);dyslipidemia     Objective:    Today's Vitals   02/06/24 0953  Weight: 110 lb (49.9 kg)  Height: 5\' 1"  (1.549 m)   Body mass index is 20.78 kg/m.     02/06/2024    9:52 AM 08/03/2023    4:57 PM 02/05/2023    2:19 PM 01/27/2023   10:44 AM 02/02/2022    9:26 AM 08/01/2016    3:12 PM  Advanced Directives  Does Patient Have a Medical Advance Directive? Yes No Yes Yes Yes Yes  Type of Estate agent of [place];Living will  Healthcare Power of [place];Living will Healthcare Power of [place];Living will Living will;Healthcare Power of Attorney Living will;Healthcare Power of Attorney  Does patient want to make changes to medical advance directive?     No - Patient declined No - Patient declined  Copy of Healthcare Power of Attorney in Chart? No - copy requested  No - copy requested Yes - validated most recent copy  scanned in chart (See row information) No - copy requested No - copy requested  Would patient like information on creating a medical advance directive?  No - Patient declined  No - Patient declined      Current Medications (verified) Outpatient Encounter Medications as of 02/06/2024  Medication Sig   Ascorbic Acid (VITAMIN C) 1000 MG tablet Take 1,000 mg by mouth daily.   CALCIUM PO Take 1 tablet by mouth daily at 6 (six) AM.   Ginkgo Biloba 120 MG CAPS Take by mouth.   Glucosamine-Chondroit-Vit C-Mn (GLUCOSAMINE 1500 COMPLEX PO) Take 1,500 mg by mouth 3 (three) times daily.   Multiple Vitamin (MULTIVITAMIN) tablet Take 1 tablet by mouth 2 (two) times daily.   Na Sulfate-K Sulfate-Mg Sulf 17.5-3.13-1.6 GM/177ML SOLN See admin instructions.   Probiotic Product (PROBIOTIC DAILY PO) Take by mouth daily. Patient takes 25 billion CFU   Turmeric (QC TUMERIC COMPLEX PO) Take 2,250 mg by mouth 3 (three) times daily.   valACYclovir (VALTREX) 500 MG tablet Take 500 mg by mouth 2 (two) times daily.   VITAMIN D, CHOLECALCIFEROL, PO Take 250 mcg by mouth.   Zinc 30 MG CAPS Take by mouth.   No facility-administered encounter medications on file as of 02/06/2024.    Allergies (verified) Sulfonamide derivatives   History: Past Medical History:  Diagnosis Date   Diverticulosis    Esophageal stricture    GERD (gastroesophageal reflux disease)    Hyperlipidemia    elevated due to very high HDL   Osteoporosis    Vitamin D deficiency  Past Surgical History:  Procedure Laterality Date   ABDOMINAL HYSTERECTOMY     USO for dysfunctional menses & endometriosis   COLONOSCOPY  10/23/2010   JP--tics-movi(exc)-81yr recall   TONSILLECTOMY     WISDOM TOOTH EXTRACTION     Family History  Problem Relation Age of Onset   Dementia Mother        lewy body   Varicose Veins Brother    Peripheral vascular disease Brother    Lung cancer Maternal Grandfather        smoker   Heart failure Paternal  Grandmother        in 67s   COPD Paternal Grandfather        smoker   Diabetes Neg Hx    Stroke Neg Hx    Colon polyps Neg Hx    Colon cancer Neg Hx    Esophageal cancer Neg Hx    Rectal cancer Neg Hx    Stomach cancer Neg Hx    Social History   Socioeconomic History   Marital status: Married    Spouse name: Not on file   Number of children: Not on file   Years of education: Not on file   Highest education level: Not on file  Occupational History   Not on file  Tobacco Use   Smoking status: Never    Passive exposure: Never   Smokeless tobacco: Never  Vaping Use   Vaping status: Never Used  Substance and Sexual Activity   Alcohol use: No   Drug use: No   Sexual activity: Not Currently  Other Topics Concern   Not on file  Social History Narrative   Married   Social Drivers of Health   Financial Resource Strain: Low Risk  (02/06/2024)   Overall Financial Resource Strain (CARDIA)    Difficulty of Paying Living Expenses: Not hard at all  Food Insecurity: No Food Insecurity (02/06/2024)   Hunger Vital Sign    Worried About Running Out of Food in the Last Year: Never true    Ran Out of Food in the Last Year: Never true  Transportation Needs: No Transportation Needs (02/06/2024)   PRAPARE - Administrator, Civil Service (Medical): No    Lack of Transportation (Non-Medical): No  Physical Activity: Sufficiently Active (02/06/2024)   Exercise Vital Sign    Days of Exercise per Week: 5 days    Minutes of Exercise per Session: 60 min  Stress: No Stress Concern Present (02/06/2024)   [employer] of Occupational Health - Occupational Stress Questionnaire    Feeling of Stress : Not at all  Social Connections: Socially Integrated (02/06/2024)   Social Connection and Isolation Panel [NHANES]    Frequency of Communication with Friends and Family: More than three times a week    Frequency of Social Gatherings with Friends and Family: More than three times a week     Attends Religious Services: More than 4 times per year    Active Member of [employer] or Organizations: Yes    Attends Engineer, structural: More than 4 times per year    Marital Status: Married    Tobacco Counseling Counseling given: No    Clinical Intake:  Pre-visit preparation completed: Yes  Pain : No/denies pain     BMI - recorded: 20.78 Nutritional Risks: None Diabetes: No  No results found for: "HGBA1C"   How often do you need to have someone help you when you read instructions, pamphlets, or other written materials  from your doctor or pharmacy?: 1 - Never  Interpreter Needed?: No  Information entered by :: Hassell Halim, CMA   Activities of Daily Living     02/06/2024    9:54 AM  In your present state of health, do you have any difficulty performing the following activities:  Hearing? 0  Vision? 0  Difficulty concentrating or making decisions? 0  Walking or climbing stairs? 0  Dressing or bathing? 0  Doing errands, shopping? 0  Preparing Food and eating ? N  Using the Toilet? N  In the past six months, have you accidently leaked urine? N  Do you have problems with loss of bowel control? N  Managing your Medications? N  Managing your Finances? N  Housekeeping or managing your Housekeeping? N    Patient Care Team: Pincus Sanes, MD as PCP - General (Internal Medicine) Sallye Lat, MD as Consulting Physician (Ophthalmology) Belva Agee, MD (Inactive) as Consulting Physician (Urology) [facility]  Indicate any recent Medical Services you may have received from other than [facility] providers in the past year (date may be approximate).     Assessment:   This is a routine wellness examination for [place].  Hearing/Vision screen Hearing Screening - Comments:: Denies hearing difficulties   Vision Screening - Comments:: Wears rx glasses - up to date with routine eye exams with Dr Dione Booze   Goals Addressed                This Visit's Progress     Patient Stated (pt-stated)        Patient stated she wants to maintain exercising.       Depression Screen     02/06/2024    9:57 AM 08/27/2023    9:08 AM 02/05/2023    2:14 PM 02/02/2022    9:25 AM 08/17/2021   12:38 PM 08/10/2020    1:22 PM 06/10/2019    8:00 AM  PHQ 2/9 Scores  PHQ - 2 Score 0 0 0 0 0 0 0  PHQ- 9 Score 0 0   0      Fall Risk     02/06/2024    9:55 AM 08/27/2023    9:08 AM 02/05/2023    2:12 PM 02/02/2022    9:28 AM 08/17/2021   11:21 AM  Fall Risk   Falls in the past year? 1 0 0 0 0  Number falls in past yr: 0 0 0 0 0  Comment 1      Injury with Fall? 0 0 0 0 0  Risk for fall due to :  No Fall Risks No Fall Risks No Fall Risks No Fall Risks  Follow up Falls evaluation completed;Falls prevention discussed Falls evaluation completed Falls prevention discussed Falls evaluation completed Falls evaluation completed    MEDICARE RISK AT HOME:  Medicare Risk at Home Any stairs in or around the home?: Yes If so, are there any without handrails?: No Home free of loose throw rugs in walkways, pet beds, electrical cords, etc?: Yes Adequate lighting in your home to reduce risk of falls?: Yes Life alert?: No Use of a cane, walker or w/c?: No Grab bars in the bathroom?: Yes Shower chair or bench in shower?: No Elevated toilet seat or a handicapped toilet?: No  TIMED UP AND GO:  Was the test performed?  No  Cognitive Function: 6CIT completed        02/06/2024    9:58 AM 02/05/2023    2:18 PM  02/02/2022    9:30 AM  6CIT Screen  What Year? 0 points 0 points 0 points  What month? 0 points 0 points 0 points  What time? 0 points 0 points 0 points  Count back from 20 0 points 0 points 0 points  Months in reverse 0 points 0 points 0 points  Repeat phrase 0 points 0 points 0 points  Total Score 0 points 0 points 0 points    Immunizations Immunization History  Administered Date(s) Administered   PFIZER(Purple Top)SARS-COV-2  Vaccination 03/04/2020, 03/29/2020   Tdap 01/22/2013, 08/03/2023    Screening Tests Health Maintenance  Topic Date Due   COVID-19 Vaccine (3 - 2024-25 season) 06/24/2023   MAMMOGRAM  08/25/2023   Medicare Annual Wellness (AWV)  02/05/2025   DEXA SCAN  08/26/2025   Colonoscopy  04/22/2031   DTaP/Tdap/Td (3 - Td or Tdap) 08/02/2033   Hepatitis C Screening  Completed   HPV VACCINES  Aged Out   Meningococcal B Vaccine  Aged Out   Pneumonia Vaccine 89+ Years old  Discontinued   INFLUENZA VACCINE  Discontinued   Zoster Vaccines- Shingrix  Discontinued    Health Maintenance  Health Maintenance Due  Topic Date Due   COVID-19 Vaccine (3 - 2024-25 season) 06/24/2023   MAMMOGRAM  08/25/2023   Health Maintenance Items Addressed: 02/06/2024   Additional Screening:  Vision Screening: Recommended annual ophthalmology exams for early detection of glaucoma and other disorders of the eye.  Dental Screening: Recommended annual dental exams for proper oral hygiene  Community Resource Referral / Chronic Care Management: CRR required this visit?  No   CCM required this visit?  No     Plan:     I have personally reviewed and noted the following in the patient's chart:   Medical and social history Use of alcohol, tobacco or illicit drugs  Current medications and supplements including opioid prescriptions. Patient is not currently taking opioid prescriptions. Functional ability and status Nutritional status Physical activity Advanced directives List of other physicians Hospitalizations, surgeries, and ER visits in previous 12 months Vitals Screenings to include cognitive, depression, and falls Referrals and appointments  In addition, I have reviewed and discussed with patient certain preventive protocols, quality metrics, and best practice recommendations. A written personalized care plan for preventive services as well as general preventive health recommendations were provided to  patient.     Patria Bookbinder, CMA   02/06/2024   After Visit Summary: (MyChart) Due to this being a telephonic visit, the after visit summary with patients personalized plan was offered to patient via MyChart   Notes: Pt stated she plans to switch PCPs to be with her Spouse - will schedule 2026 AWV at that office ([place] - Dr Gavin Kast).

## 2024-02-06 NOTE — Patient Instructions (Addendum)
 Robyn Hudson , Thank you for taking time to come for your Medicare Wellness Visit. I appreciate your ongoing commitment to your health goals. Please review the following plan we discussed and let me know if I can assist you in the future.   Referrals/Orders/Follow-Ups/Clinician Recommendations: Aim for 30 minutes of exercise or brisk walking, 6-8 glasses of water, and 5 servings of fruits and vegetables each day.   This is a list of the screening recommended for you and due dates:  Health Maintenance  Topic Date Due   COVID-19 Vaccine (3 - 2024-25 season) 06/24/2023   Mammogram  08/25/2023   Medicare Annual Wellness Visit  02/05/2025   DEXA scan (bone density measurement)  08/26/2025   Colon Cancer Screening  04/22/2031   DTaP/Tdap/Td vaccine (3 - Td or Tdap) 08/02/2033   Hepatitis C Screening  Completed   HPV Vaccine  Aged Out   Meningitis B Vaccine  Aged Out   Pneumonia Vaccine  Discontinued   Flu Shot  Discontinued   Zoster (Shingles) Vaccine  Discontinued    Advanced directives: (Copy Requested) Please bring a copy of your health care power of attorney and living will to the office to be added to your chart at your convenience. You can mail to [facility] [address] [place], [place] [address] or email to [email or web address]  Next Medicare Annual Wellness Visit scheduled for next year: Yes

## 2024-06-10 ENCOUNTER — Ambulatory Visit
Admission: EM | Admit: 2024-06-10 | Discharge: 2024-06-10 | Disposition: A | Discharge status: Home / Self Care | Attending: Physician Assistant | Admitting: Physician Assistant

## 2024-06-10 ENCOUNTER — Other Ambulatory Visit: Payer: Self-pay

## 2024-06-10 ENCOUNTER — Encounter: Payer: Self-pay | Admitting: Emergency Medicine

## 2024-06-10 DIAGNOSIS — N3000 Acute cystitis without hematuria: Secondary | ICD-10-CM | POA: Insufficient documentation

## 2024-06-10 LAB — POCT URINE DIPSTICK
Bilirubin, UA: NEGATIVE
Glucose, UA: NEGATIVE mg/dL
Ketones, POC UA: NEGATIVE mg/dL
Nitrite, UA: NEGATIVE
POC PROTEIN,UA: NEGATIVE
Spec Grav, UA: 1.025 (ref 1.010–1.025)
Urobilinogen, UA: 0.2 U/dL
pH, UA: 6 (ref 5.0–8.0)

## 2024-06-10 MED ORDER — NITROFURANTOIN MONOHYD MACRO 100 MG PO CAPS
100.0000 mg | ORAL_CAPSULE | Freq: Two times a day (BID) | ORAL | 0 refills | Status: DC
Start: 2024-06-10 — End: 2024-08-07

## 2024-06-10 MED ORDER — CIPROFLOXACIN HCL 500 MG PO TABS: 500.0000 mg | ORAL_TABLET | Freq: Two times a day (BID) | ORAL | 0 refills | Status: DC

## 2024-06-10 NOTE — ED Provider Notes (Signed)
 [facility]    CSN: [id number] Arrival date & time: 06/10/24  1530      History   Chief Complaint No chief complaint on file.   HPI Robyn Hudson is a 68 y.o. female.   The history is provided by the patient.    Past Medical History:  Diagnosis Date   Diverticulosis    Esophageal stricture    GERD (gastroesophageal reflux disease)    Hyperlipidemia    elevated due to very high HDL   Osteoporosis    Vitamin D  deficiency     Patient Active Problem List   Diagnosis Date Noted   Spider veins of both lower extremities 08/01/2016   History of basal cell carcinoma (BCC) of skin 07/26/2016   Esophageal reflux 09/28/2015   Varicose veins of both lower extremities 04/27/2014   Vitamin D  deficiency 04/02/2013   Hyperlipidemia 10/04/2010   Osteoporosis 10/04/2010    Past Surgical History:  Procedure Laterality Date   ABDOMINAL HYSTERECTOMY     USO for dysfunctional menses & endometriosis   COLONOSCOPY  10/23/2010   JP--tics-movi(exc)-46yr recall   TONSILLECTOMY     WISDOM TOOTH EXTRACTION      OB History   No obstetric history on file.      Home Medications    Prior to Admission medications   Medication Sig Start Date End Date Taking? Authorizing Provider  Ascorbic Acid (VITAMIN C) 1000 MG tablet Take 1,000 mg by mouth daily.    [provider]  CALCIUM PO Take 1 tablet by mouth daily at 6 (six) AM.    [provider]  Ginkgo Biloba 120 MG CAPS Take by mouth.    [provider]  Glucosamine-Chondroit-Vit C-Mn (GLUCOSAMINE 1500 COMPLEX PO) Take 1,500 mg by mouth 3 (three) times daily.    [provider]  Multiple Vitamin (MULTIVITAMIN) tablet Take 1 tablet by mouth 2 (two) times daily.    [provider]  Na Sulfate-K Sulfate-Mg Sulf 17.5-3.13-1.6 GM/177ML SOLN See admin instructions. 11/20/22   [provider]  Probiotic Product (PROBIOTIC DAILY PO) Take by mouth daily. Patient takes 25  billion CFU    [provider]  Turmeric (QC TUMERIC COMPLEX PO) Take 2,250 mg by mouth 3 (three) times daily.    [provider]  valACYclovir (VALTREX) 500 MG tablet Take 500 mg by mouth 2 (two) times daily. 06/30/21   [provider]  VITAMIN D , CHOLECALCIFEROL, PO Take 250 mcg by mouth.    [provider]  Zinc 30 MG CAPS Take by mouth.    [provider]    Family History Family History  Problem Relation Age of Onset   Dementia Mother        lewy body   Varicose Veins Brother    Peripheral vascular disease Brother    Lung cancer Maternal Grandfather        smoker   Heart failure Paternal Grandmother        in 29s   COPD Paternal Grandfather        smoker   Diabetes Neg Hx    Stroke Neg Hx    Colon polyps Neg Hx    Colon cancer Neg Hx    Esophageal cancer Neg Hx    Rectal cancer Neg Hx    Stomach cancer Neg Hx     Social History Social History   Tobacco Use   Smoking status: Never    Passive exposure: Never   Smokeless tobacco:  Never  Vaping Use   Vaping status: Never Used  Substance Use Topics   Alcohol use: No   Drug use: No     Allergies   Sulfonamide derivatives   Review of Systems Review of Systems  Constitutional:  Negative for chills and fever.  Eyes:  Negative for discharge and redness.  Respiratory:  Negative for shortness of breath.   Gastrointestinal:  Negative for abdominal pain, nausea and vomiting.     Physical Exam Triage Vital Signs ED Triage Vitals  Encounter Vitals Group     BP      Girls Systolic BP Percentile      Girls Diastolic BP Percentile      Boys Systolic BP Percentile      Boys Diastolic BP Percentile      Pulse      Resp      Temp      Temp src      SpO2      Weight      Height      Head Circumference      Peak Flow      Pain Score      Pain Loc      Pain Education      Exclude from Growth Chart    No data found.  Updated Vital Signs There were no vitals taken  for this visit.  Visual Acuity Right Eye Distance:   Left Eye Distance:   Bilateral Distance:    Right Eye Near:   Left Eye Near:    Bilateral Near:     Physical Exam Vitals and nursing note reviewed.  Constitutional:      General: She is not in acute distress.    Appearance: Normal appearance. She is not ill-appearing.  HENT:     Head: Normocephalic and atraumatic.  Eyes:     Conjunctiva/sclera: Conjunctivae normal.  Cardiovascular:     Rate and Rhythm: Normal rate.  Pulmonary:     Effort: Pulmonary effort is normal.  Neurological:     Mental Status: She is alert.  Psychiatric:        Mood and Affect: Mood normal.        Behavior: Behavior normal.        Thought Content: Thought content normal.      UC Treatments / Results  Labs (all labs ordered are listed, but only abnormal results are displayed) Labs Reviewed - No data to display  EKG   Radiology No results found.  Procedures Procedures (including critical care time)  Medications Ordered in UC Medications - No data to display  Initial Impression / Assessment and Plan / UC Course  I have reviewed the triage vital signs and the nursing notes.  Pertinent labs & imaging results that were available during my care of the patient were reviewed by me and considered in my medical decision making (see chart for details).     *** Final Clinical Impressions(s) / UC Diagnoses   Final diagnoses:  None   Discharge Instructions   None    ED Prescriptions   None    PDMP not reviewed this encounter.

## 2024-06-10 NOTE — ED Triage Notes (Signed)
 Pt here for dysuria x 2 weeks with hx of UTI in past

## 2024-06-12 ENCOUNTER — Encounter: Payer: Self-pay | Admitting: Physician Assistant

## 2024-06-12 ENCOUNTER — Ambulatory Visit (HOSPITAL_COMMUNITY): Payer: Self-pay

## 2024-06-12 LAB — URINE CULTURE: Culture: >=100000 — AB

## 2024-08-07 ENCOUNTER — Ambulatory Visit
Admission: EM | Admit: 2024-08-07 | Discharge: 2024-08-07 | Disposition: A | Discharge status: Home / Self Care | Attending: Family Medicine | Admitting: Family Medicine

## 2024-08-07 ENCOUNTER — Encounter: Payer: Self-pay | Admitting: Emergency Medicine

## 2024-08-07 DIAGNOSIS — N3090 Cystitis, unspecified without hematuria: Secondary | ICD-10-CM | POA: Diagnosis present

## 2024-08-07 LAB — POCT URINE DIPSTICK
Bilirubin, UA: NEGATIVE
Glucose, UA: NEGATIVE mg/dL
Ketones, POC UA: NEGATIVE mg/dL
Nitrite, UA: POSITIVE — AB
POC PROTEIN,UA: NEGATIVE
Spec Grav, UA: 1.015 (ref 1.010–1.025)
Urobilinogen, UA: 0.2 U/dL
pH, UA: 6.5 (ref 5.0–8.0)

## 2024-08-07 MED ORDER — NITROFURANTOIN MONOHYD MACRO 100 MG PO CAPS: 100.0000 mg | ORAL_CAPSULE | Freq: Two times a day (BID) | ORAL | 0 refills | Status: AC

## 2024-08-07 NOTE — ED Provider Notes (Signed)
 [facility]    CSN: [id number] Arrival date & time: 08/07/24  0805      History   Chief Complaint Chief Complaint  Patient presents with   Dysuria   Urinary Frequency    HPI Robyn Hudson is a 68 y.o. female.    Dysuria Urinary Frequency   Here for dysuria and urinary frequency and incomplete bladder emptying.  She had had symptoms for about a week and then got better taking AZO.  Then last night they started bothering her again.  No fever or chills and no nausea or vomiting.  No back pain  She is allergic to sulfa  She had a UTI verified on culture in mid August of this year.  She states she got about 18 months without a UTI.  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 Past Medical History:  Diagnosis Date   Diverticulosis    Esophageal stricture    GERD (gastroesophageal reflux disease)    Hyperlipidemia    elevated due to very high HDL   Osteoporosis    Vitamin D  deficiency     Patient Active Problem List   Diagnosis Date Noted   Spider veins of both lower extremities 08/01/2016   History of basal cell carcinoma (BCC) of skin 07/26/2016   Esophageal reflux 09/28/2015   Varicose veins of both lower extremities 04/27/2014   Vitamin D  deficiency 04/02/2013   Hyperlipidemia 10/04/2010   Osteoporosis 10/04/2010    Past Surgical History:  Procedure Laterality Date   ABDOMINAL HYSTERECTOMY     USO for dysfunctional menses & endometriosis   COLONOSCOPY  10/23/2010   JP--tics-movi(exc)-60yr recall   TONSILLECTOMY     WISDOM TOOTH EXTRACTION      OB History   No obstetric history on file.      Home Medications    Prior to Admission medications   Medication Sig Start Date End Date Taking? Authorizing Provider  Ascorbic Acid (VITAMIN C) 1000 MG tablet Take 1,000 mg by mouth daily.   Yes [provider]  CALCIUM PO Take 1 tablet by mouth daily at 6 (six) AM.   Yes [provider]  Ginkgo Biloba 120  MG CAPS Take by mouth.   Yes [provider]  Glucosamine-Chondroit-Vit C-Mn (GLUCOSAMINE 1500 COMPLEX PO) Take 1,500 mg by mouth 3 (three) times daily.   Yes [provider]  Probiotic Product (PROBIOTIC DAILY PO) Take by mouth daily. Patient takes 25 billion CFU   Yes [provider]  Turmeric (QC TUMERIC COMPLEX PO) Take 2,250 mg by mouth 3 (three) times daily.   Yes [provider]  VITAMIN D , CHOLECALCIFEROL, PO Take 250 mcg by mouth.   Yes [provider]  Zinc 30 MG CAPS Take by mouth.   Yes [provider]  Multiple Vitamin (MULTIVITAMIN) tablet Take 1 tablet by mouth 2 (two) times daily. Patient not taking: Reported on 08/07/2024    [provider]  Na Sulfate-K Sulfate-Mg Sulf 17.5-3.13-1.6 GM/177ML SOLN See admin instructions. Patient not taking: Reported on 08/07/2024 11/20/22   [provider]  nitrofurantoin , macrocrystal-monohydrate, (MACROBID ) 100 MG capsule Take 1 capsule (100 mg total) by mouth 2 (two) times daily for 7 days. 08/07/24 08/14/24  Jakel Alphin K, MD  valACYclovir (VALTREX) 500 MG tablet Take 500 mg by mouth 2 (two) times daily. Patient not taking: Reported on 08/07/2024 06/30/21   [provider]    Family History Family History  Problem Relation Age of  Onset   Dementia Mother        lewy body   Varicose Veins Brother    Peripheral vascular disease Brother    Lung cancer Maternal Grandfather        smoker   Heart failure Paternal Grandmother        in 60s   COPD Paternal Grandfather        smoker   Diabetes Neg Hx    Stroke Neg Hx    Colon polyps Neg Hx    Colon cancer Neg Hx    Esophageal cancer Neg Hx    Rectal cancer Neg Hx    Stomach cancer Neg Hx     Social History Social History   Tobacco Use   Smoking status: Never    Passive exposure: Never   Smokeless tobacco: Never  Vaping Use   Vaping status: Never Used  Substance Use Topics   Alcohol use: No    Drug use: No     Allergies   Sulfonamide derivatives   Review of Systems Review of Systems  Genitourinary:  Positive for dysuria and frequency.     Physical Exam Triage Vital Signs ED Triage Vitals [08/07/24 0848]  Encounter Vitals Group     BP 128/81     Girls Systolic BP Percentile      Girls Diastolic BP Percentile      Boys Systolic BP Percentile      Boys Diastolic BP Percentile      Pulse Rate 83     Resp 14     Temp 98 F (36.7 C)     Temp Source Oral     SpO2 96 %     Weight      Height      Head Circumference      Peak Flow      Pain Score 2     Pain Loc      Pain Education      Exclude from Growth Chart    No data found.  Updated Vital Signs BP 128/81 (BP Location: Left Arm)   Pulse 83   Temp 98 F (36.7 C) (Oral)   Resp 14   SpO2 96%   Visual Acuity Right Eye Distance:   Left Eye Distance:   Bilateral Distance:    Right Eye Near:   Left Eye Near:    Bilateral Near:     Physical Exam Vitals reviewed.  Constitutional:      General: She is not in acute distress.    Appearance: She is not toxic-appearing.  HENT:     Mouth/Throat:     Mouth: Mucous membranes are moist.  Cardiovascular:     Rate and Rhythm: Normal rate and regular rhythm.     Heart sounds: No murmur heard. Pulmonary:     Effort: Pulmonary effort is normal.     Breath sounds: Normal breath sounds.  Abdominal:     Palpations: Abdomen is soft.     Tenderness: There is no abdominal tenderness.  Skin:    Coloration: Skin is not pale.  Neurological:     Mental Status: She is alert and oriented to person, place, and time.  Psychiatric:        Behavior: Behavior normal.      UC Treatments / Results  Labs (all labs ordered are listed, but only abnormal results are displayed) Labs Reviewed  POCT URINE DIPSTICK - Abnormal; Notable for the following components:      Result  Value   Clarity, UA cloudy (*)    Blood, UA trace-lysed (*)    Nitrite, UA Positive (*)     Leukocytes, UA Small (1+) (*)    All other components within normal limits  URINE CULTURE    EKG   Radiology No results found.  Procedures Procedures (including critical care time)  Medications Ordered in UC Medications - No data to display  Initial Impression / Assessment and Plan / UC Course  I have reviewed the triage vital signs and the nursing notes.  Pertinent labs & imaging results that were available during my care of the patient were reviewed by me and considered in my medical decision making (see chart for details).     UA has positive nitrites, small amount of leuks and a trace of red blood cells.  Urine culture is sent She requested to repeat the nitrofurantoin  treatment but may be a longer course this time.  Last urine culture showed E. coli pansensitive.  Nitrofurantoin  is sent in for a 7-day supply.  I discussed with her things that might be helpful from her primary care or gynecologist to reduce the frequency of the recurrent UTIs.  She will follow-up with them. Final Clinical Impressions(s) / UC Diagnoses   Final diagnoses:  Cystitis     Discharge Instructions      The urinalysis showed some white blood cells and red blood cells and nitrites, consistent with a bladder infection.  Take nitrofurantoin  100 mg--1 capsule 2 times daily for 7 days  Urine culture is sent, and staff will notify you that it looks like the antibiotic needs to be changed.  Please discuss this recurring problem with your primary care and OB/GYN specialist.     ED Prescriptions     Medication Sig Dispense Auth. Provider   nitrofurantoin , macrocrystal-monohydrate, (MACROBID ) 100 MG capsule Take 1 capsule (100 mg total) by mouth 2 (two) times daily for 7 days. 14 capsule Vonna, Parrie Rasco K, MD      PDMP not reviewed this encounter.   Vonna Sharlet POUR, MD 08/07/24 [phone]

## 2024-08-07 NOTE — ED Triage Notes (Signed)
 Pt reports dysuria, frequency, and lower abdominal pain that started at midnight. Reports she had symptoms last week and took AZO, symptoms subsided until last night. Denies fevers, hematuria, and flank pain.

## 2024-08-07 NOTE — Discharge Instructions (Addendum)
 The urinalysis showed some white blood cells and red blood cells and nitrites, consistent with a bladder infection.  Take nitrofurantoin  100 mg--1 capsule 2 times daily for 7 days  Urine culture is sent, and staff will notify you that it looks like the antibiotic needs to be changed.  Please discuss this recurring problem with your primary care and OB/GYN specialist.

## 2024-08-09 LAB — URINE CULTURE: Culture: 30000 — AB

## 2024-08-11 ENCOUNTER — Ambulatory Visit (HOSPITAL_COMMUNITY): Payer: Self-pay

## 2024-08-26 ENCOUNTER — Ambulatory Visit: Payer: Medicare Other | Admitting: Internal Medicine

## 2024-08-27 ENCOUNTER — Encounter: Payer: Self-pay | Admitting: Internal Medicine

## 2024-08-27 ENCOUNTER — Ambulatory Visit (INDEPENDENT_AMBULATORY_CARE_PROVIDER_SITE_OTHER): Admitting: Internal Medicine

## 2024-08-27 VITALS — BP 128/80 | HR 75 | Temp 98.6°F | Ht 61.0 in | Wt 111.8 lb

## 2024-08-27 DIAGNOSIS — Z1329 Encounter for screening for other suspected endocrine disorder: Secondary | ICD-10-CM | POA: Diagnosis not present

## 2024-08-27 DIAGNOSIS — Z85828 Personal history of other malignant neoplasm of skin: Secondary | ICD-10-CM

## 2024-08-27 DIAGNOSIS — M810 Age-related osteoporosis without current pathological fracture: Secondary | ICD-10-CM

## 2024-08-27 DIAGNOSIS — E782 Mixed hyperlipidemia: Secondary | ICD-10-CM | POA: Diagnosis not present

## 2024-08-27 DIAGNOSIS — E559 Vitamin D deficiency, unspecified: Secondary | ICD-10-CM | POA: Diagnosis not present

## 2024-08-27 DIAGNOSIS — K219 Gastro-esophageal reflux disease without esophagitis: Secondary | ICD-10-CM | POA: Diagnosis not present

## 2024-08-27 DIAGNOSIS — Z87440 Personal history of urinary (tract) infections: Secondary | ICD-10-CM

## 2024-08-27 LAB — CBC WITH DIFFERENTIAL/PLATELET
Basophils Absolute: 0.1 K/uL (ref 0.0–0.1)
Basophils Relative: 1.3 % (ref 0.0–3.0)
Eosinophils Absolute: 0.2 K/uL (ref 0.0–0.7)
Eosinophils Relative: 4 % (ref 0.0–5.0)
HCT: 39.1 % (ref 36.0–46.0)
Hemoglobin: 12.9 g/dL (ref 12.0–15.0)
Lymphocytes Relative: 31.1 % (ref 12.0–46.0)
Lymphs Abs: 1.9 K/uL (ref 0.7–4.0)
MCHC: 33 g/dL (ref 30.0–36.0)
MCV: 91.7 fl (ref 78.0–100.0)
Monocytes Absolute: 0.6 K/uL (ref 0.1–1.0)
Monocytes Relative: 10.4 % (ref 3.0–12.0)
Neutro Abs: 3.2 K/uL (ref 1.4–7.7)
Neutrophils Relative %: 53.2 % (ref 43.0–77.0)
Platelets: 326 K/uL (ref 150.0–400.0)
RBC: 4.26 Mil/uL (ref 3.87–5.11)
RDW: 14.4 % (ref 11.5–15.5)
WBC: 6.1 K/uL (ref 4.0–10.5)

## 2024-08-27 LAB — COMPREHENSIVE METABOLIC PANEL WITH GFR
ALT: 14 U/L (ref 0–35)
AST: 19 U/L (ref 0–37)
Albumin: 4.4 g/dL (ref 3.5–5.2)
Alkaline Phosphatase: 60 U/L (ref 39–117)
BUN: 15 mg/dL (ref 6–23)
CO2: 27 meq/L (ref 19–32)
Calcium: 9.6 mg/dL (ref 8.4–10.5)
Chloride: 105 meq/L (ref 96–112)
Creatinine, Ser: 0.63 mg/dL (ref 0.40–1.20)
GFR: 90.39 mL/min (ref >60.00)
Glucose, Bld: 81 mg/dL (ref 70–99)
Potassium: 4.2 meq/L (ref 3.5–5.1)
Sodium: 140 meq/L (ref 135–145)
Total Bilirubin: 0.4 mg/dL (ref 0.2–1.2)
Total Protein: 7 g/dL (ref 6.0–8.3)

## 2024-08-27 LAB — LIPID PANEL
Cholesterol: 221 mg/dL — ABNORMAL HIGH (ref 0–200)
HDL: 78.2 mg/dL (ref >39.00)
LDL Cholesterol: 133 mg/dL — ABNORMAL HIGH (ref 0–99)
NonHDL: 143.26
Total CHOL/HDL Ratio: 3
Triglycerides: 51 mg/dL (ref 0.0–149.0)
VLDL: 10.2 mg/dL (ref 0.0–40.0)

## 2024-08-27 NOTE — Progress Notes (Signed)
 [facility] [address] [place] [address] Dept: [phone] Dept Fax: [phone]  New Patient Office Visit  Subjective:   Robyn Hudson 03/21/1956 08/27/2024  Chief Complaint  Patient presents with   Establish Care    No concerns    HPI: AMRITA Hudson presents today to establish care at [facility] at [facility] [place]. Introduced to publishing rights manager role and practice setting.  All questions answered.  Concerns: See below   Discussed the use of AI scribe software for clinical note transcription with the patient, who gave verbal consent to proceed.  History of Present Illness   Robyn Hudson is a 68 year old female who presents to establish care as a new patient.  She has a history of recurrent urinary tract infections, with recent episodes in August and October after an 91-month period without infections. She takes D-mannose without cranberry due to sensitivity issues.  She maintains an active lifestyle, walking two miles three times a week and performing weight-bearing exercises with three-pound barbells for ten minutes five days a week. She also takes glucosamine supplements.  She is currently taking a vitamin D  supplement three times a week and a calcium supplement daily for osteoporosis. Her last bone density scan was conducted last year. She has a scheduled dermatology appointment in December for a body check, which she does annually for history of BCC. She also attends dental check-ups twice a year and eye exams once a year. Her mammogram is due, and she plans to have it done with [facility] next week.   History of hyperlipidemia, not currently on medication treatment. Managed with diet and exercise.   She does not receive flu or pneumonia vaccines.      The following portions of the patient's history were reviewed and updated as appropriate: past medical history, past surgical  history, family history, social history, allergies, medications, and problem list.   Patient Active Problem List   Diagnosis Date Noted   Spider veins of both lower extremities 08/01/2016   History of basal cell carcinoma (BCC) of skin 07/26/2016   Esophageal reflux 09/28/2015   Varicose veins of both lower extremities 04/27/2014   Vitamin D  deficiency 04/02/2013   Hyperlipidemia 10/04/2010   Osteoporosis 10/04/2010   Past Medical History:  Diagnosis Date   Diverticulosis    Esophageal stricture    GERD (gastroesophageal reflux disease)    Hyperlipidemia    elevated due to very high HDL   Osteoporosis    Vitamin D  deficiency    Past Surgical History:  Procedure Laterality Date   ABDOMINAL HYSTERECTOMY     USO for dysfunctional menses & endometriosis   COLONOSCOPY  10/23/2010   JP--tics-movi(exc)-15yr recall   TONSILLECTOMY     WISDOM TOOTH EXTRACTION     Family History  Problem Relation Age of Onset   Dementia Mother        lewy body   Varicose Veins Brother    Peripheral vascular disease Brother    Lung cancer Maternal Grandfather        smoker   Heart failure Paternal Grandmother        in 59s   COPD Paternal Grandfather        smoker   Diabetes Neg Hx    Stroke Neg Hx    Colon polyps Neg Hx    Colon cancer Neg Hx    Esophageal cancer Neg Hx    Rectal cancer Neg Hx  Stomach cancer Neg Hx     Current Outpatient Medications:    Ascorbic Acid (VITAMIN C) 1000 MG tablet, Take 1,000 mg by mouth daily., Disp: , Rfl:    CALCIUM PO, Take 1 tablet by mouth daily at 6 (six) AM., Disp: , Rfl:    Ginkgo Biloba 120 MG CAPS, Take by mouth., Disp: , Rfl:    Glucosamine-Chondroit-Vit C-Mn (GLUCOSAMINE 1500 COMPLEX PO), Take 1,500 mg by mouth 3 (three) times daily., Disp: , Rfl:    Probiotic Product (PROBIOTIC DAILY PO), Take by mouth daily. Patient takes 25 billion CFU, Disp: , Rfl:    Turmeric (QC TUMERIC COMPLEX PO), Take 2,250 mg by mouth 3 (three) times daily., Disp:  , Rfl:    VITAMIN D , CHOLECALCIFEROL, PO, Take 250 mcg by mouth., Disp: , Rfl:    Zinc 30 MG CAPS, Take by mouth., Disp: , Rfl:    Multiple Vitamin (MULTIVITAMIN) tablet, Take 1 tablet by mouth 2 (two) times daily. (Patient not taking: Reported on 08/07/2024), Disp: , Rfl:    Na Sulfate-K Sulfate-Mg Sulf 17.5-3.13-1.6 GM/177ML SOLN, See admin instructions. (Patient not taking: Reported on 08/07/2024), Disp: , Rfl:    valACYclovir (VALTREX) 500 MG tablet, Take 500 mg by mouth 2 (two) times daily. (Patient not taking: Reported on 08/07/2024), Disp: , Rfl:  Allergies  Allergen Reactions   Sulfonamide Derivatives Hives    Urticaria    ROS: A complete ROS was performed with pertinent positives/negatives noted in the HPI. The remainder of the ROS are negative.   Objective:   Today's Vitals   08/27/24 0836  BP: 128/80  Pulse: 75  Temp: 98.6 F (37 C)  TempSrc: Temporal  SpO2: 99%  Weight: 111 lb 12.8 oz (50.7 kg)  Height: 5' 1 (1.549 m)    GENERAL: Well-appearing, in NAD. Well nourished.  SKIN: Pink, warm and dry.  NECK: Trachea midline. Full ROM w/o pain or tenderness. No lymphadenopathy. No thyromegaly or palpable masses.  RESPIRATORY: Chest wall symmetrical. Respirations even and non-labored. Breath sounds clear to auscultation bilaterally.  CARDIAC: S1, S2 present, regular rate and rhythm. Peripheral pulses 2+ bilaterally.  EXTREMITIES: Without clubbing, cyanosis, or edema.  NEUROLOGIC: Steady, even gait.  PSYCH/MENTAL STATUS: Alert, oriented x 3. Cooperative, appropriate mood and affect.   Health Maintenance Due  Topic Date Due   Mammogram  08/26/2024    No results found for any visits on 08/27/24.  Assessment & Plan:  Assessment and Plan    Osteoporosis Managed with calcium and vitamin D  supplementation. Engages in weight-bearing exercises. - Continue calcium and vitamin D  supplementation. - Encouraged continuation of weight-bearing exercises. - repeat dexa  2026  Hyperlipidemia Monitored without statin therapy. Current management is deemed sufficient. - Continue monitoring lipid levels.  Vitamin D  deficiency Managed with supplementation. Current regimen is 250 micrograms three times a week. - Continue current vitamin D  supplementation regimen.  History of basal cell carcinoma of skin Annual dermatology check scheduled for December. - Continue annual dermatology check-ups.  Recurrent urinary tract infections Recent episodes in August and October. Managed with D-mannose and increased fluid intake. Avoids cranberry juice due to acidity. - Continue D-mannose supplementation. - Encouraged increased fluid intake.  General Health Maintenance Up to date on tetanus vaccination. Declines flu and pneumonia vaccines. Mammogram due and scheduled with [facility]. - Ensure mammogram results are sent to primary care for record update. - Continue routine health maintenance screenings.       Orders Placed This Encounter  Procedures  CBC with Differential/Platelet   Comp Met (CMET)   Lipid panel   VITAMIN D  25 Hydroxy (Vit-D Deficiency, Fractures)   TSH   No orders of the defined types were placed in this encounter.   Return in about 1 year (around 08/27/2025) for Chronic Condition follow up.   Rosina Senters, FNP

## 2024-08-28 ENCOUNTER — Ambulatory Visit: Payer: Self-pay | Admitting: Internal Medicine

## 2024-08-28 LAB — TSH: TSH: 1.64 u[IU]/mL (ref 0.35–5.50)

## 2024-08-28 LAB — VITAMIN D 25 HYDROXY (VIT D DEFICIENCY, FRACTURES): VITD: 77.77 ng/mL (ref 30.00–100.00)

## 2024-09-08 ENCOUNTER — Ambulatory Visit: Payer: Self-pay

## 2024-09-08 ENCOUNTER — Encounter: Payer: Self-pay | Admitting: Internal Medicine

## 2024-09-08 ENCOUNTER — Ambulatory Visit: Admitting: Internal Medicine

## 2024-09-08 VITALS — BP 128/80 | HR 84 | Temp 98.4°F | Ht 61.0 in | Wt 113.8 lb

## 2024-09-08 DIAGNOSIS — N390 Urinary tract infection, site not specified: Secondary | ICD-10-CM

## 2024-09-08 LAB — POC URINALSYSI DIPSTICK (AUTOMATED)
Glucose, UA: NEGATIVE
Ketones, UA: NEGATIVE
Protein, UA: POSITIVE — AB
Spec Grav, UA: 1.015 (ref 1.010–1.025)
Urobilinogen, UA: 1 U/dL
pH, UA: 6 (ref 5.0–8.0)

## 2024-09-08 MED ORDER — NITROFURANTOIN MONOHYD MACRO 100 MG PO CAPS
100.0000 mg | ORAL_CAPSULE | Freq: Two times a day (BID) | ORAL | 0 refills | Status: AC
Start: 2024-09-08 — End: 2024-09-15

## 2024-09-08 NOTE — Progress Notes (Signed)
 [facility] [address] [place] [address] Dept: [phone] Dept Fax: [phone]  Acute Care Office Visit  Subjective:   Robyn Hudson [birth year] 09/08/2024  Chief Complaint  Patient presents with   Urinary Tract Infection    Painful, burning, urgency. AZO started 10/08/24    HPI: Discussed the use of AI scribe software for clinical note transcription with the patient, who gave verbal consent to proceed.  History of Present Illness   Robyn Hudson is a 68 year old female with recurrent urinary tract infections who presents with dysuria and urinary urgency.  She experiences pain and burning with urination, along with urinary urgency, which began at midnight last night. This is her third urinary tract infection since August. Prior to this, she had an 66-month period without UTIs, although before that, she experienced them every 8 to 10 weeks.  Last week, during a visit to the gynecologist, a urine test showed a small amount of blood, but the culture was clear. Was started on estrace cream once daily, but could not tolerate as she had side effects of headache and palpitations.   No flank pain, back pain, nausea, vomiting, or fever.        The following portions of the patient's history were reviewed and updated as appropriate: past medical history, past surgical history, family history, social history, allergies, medications, and problem list.   Patient Active Problem List   Diagnosis Date Noted   Spider veins of both lower extremities 08/01/2016   History of basal cell carcinoma (BCC) of skin 07/26/2016   Esophageal reflux 09/28/2015   Varicose veins of both lower extremities 04/27/2014   Vitamin D  deficiency 04/02/2013   Hyperlipidemia 10/04/2010   Osteoporosis 10/04/2010   Past Medical History:  Diagnosis Date   Diverticulosis    Esophageal stricture    GERD (gastroesophageal reflux disease)     Hyperlipidemia    elevated due to very high HDL   Osteoporosis    Vitamin D  deficiency    Past Surgical History:  Procedure Laterality Date   ABDOMINAL HYSTERECTOMY     USO for dysfunctional menses & endometriosis   COLONOSCOPY  10/23/2010   JP--tics-movi(exc)-41yr recall   TONSILLECTOMY     WISDOM TOOTH EXTRACTION     Family History  Problem Relation Age of Onset   Dementia Mother        lewy body   Varicose Veins Brother    Peripheral vascular disease Brother    Lung cancer Maternal Grandfather        smoker   Heart failure Paternal Grandmother        in 89s   COPD Paternal Grandfather        smoker   Diabetes Neg Hx    Stroke Neg Hx    Colon polyps Neg Hx    Colon cancer Neg Hx    Esophageal cancer Neg Hx    Rectal cancer Neg Hx    Stomach cancer Neg Hx     Current Outpatient Medications:    Ascorbic Acid (VITAMIN C) 1000 MG tablet, Take 1,000 mg by mouth daily., Disp: , Rfl:    CALCIUM PO, Take 1 tablet by mouth daily at 6 (six) AM., Disp: , Rfl:    Ginkgo Biloba 120 MG CAPS, Take by mouth., Disp: , Rfl:    Glucosamine-Chondroit-Vit C-Mn (GLUCOSAMINE 1500 COMPLEX PO), Take 1,500 mg by mouth 3 (three) times daily., Disp: , Rfl:    nitrofurantoin ,  macrocrystal-monohydrate, (MACROBID ) 100 MG capsule, Take 1 capsule (100 mg total) by mouth 2 (two) times daily for 7 days., Disp: 14 capsule, Rfl: 0   Probiotic Product (PROBIOTIC DAILY PO), Take by mouth daily. Patient takes 25 billion CFU, Disp: , Rfl:    Turmeric (QC TUMERIC COMPLEX PO), Take 2,250 mg by mouth 3 (three) times daily., Disp: , Rfl:    VITAMIN D , CHOLECALCIFEROL, PO, Take 250 mcg by mouth., Disp: , Rfl:    Zinc 30 MG CAPS, Take by mouth., Disp: , Rfl:    Multiple Vitamin (MULTIVITAMIN) tablet, Take 1 tablet by mouth 2 (two) times daily. (Patient not taking: Reported on 08/07/2024), Disp: , Rfl:    Na Sulfate-K Sulfate-Mg Sulf 17.5-3.13-1.6 GM/177ML SOLN, See admin instructions. (Patient not taking: Reported  on 08/07/2024), Disp: , Rfl:    valACYclovir (VALTREX) 500 MG tablet, Take 500 mg by mouth 2 (two) times daily. (Patient not taking: Reported on 08/07/2024), Disp: , Rfl:  Allergies  Allergen Reactions   Sulfonamide Derivatives Hives    Urticaria     ROS: A complete ROS was performed with pertinent positives/negatives noted in the HPI. The remainder of the ROS are negative.    Objective:   Today's Vitals   09/08/24 1348  BP: 128/80  Pulse: 84  Temp: 98.4 F (36.9 C)  TempSrc: Temporal  SpO2: 96%  Weight: 113 lb 12.8 oz (51.6 kg)  Height: 5' 1 (1.549 m)    GENERAL: Well-appearing, in NAD. Well nourished.  SKIN: Pink, warm and dry.  RESPIRATORY: Chest wall symmetrical. Respirations even and non-labored. Breath sounds clear to auscultation bilaterally.  CARDIAC: S1, S2 present, regular rate and rhythm. Peripheral pulses 2+ bilaterally.  GI: Abdomen soft, non-tender. No CVA tenderness.  PSYCH/MENTAL STATUS: Alert, oriented x 3. Cooperative, appropriate mood and affect.    Results for orders placed or performed in visit on 09/08/24  POCT Urinalysis Dipstick (Automated)  Result Value Ref Range   Color, UA     Clarity, UA     Glucose, UA Negative Negative   Bilirubin, UA 1+    Ketones, UA negative    Spec Grav, UA 1.015 1.010 - 1.025   Blood, UA 1+    pH, UA 6.0 5.0 - 8.0   Protein, UA Positive (A) Negative   Urobilinogen, UA 1.0 0.2 or 1.0 E.U./dL   Nitrite, UA +    Leukocytes, UA Large (3+) (A) Negative      Assessment & Plan:  Assessment and Plan    Acute UTI  - Prescribed Macrobid  100 mg twice daily for 7 days. - Sent urine culture for bacterial identification and potential antibiotic adjustment. - Advised reducing estrogen cream to twice weekly to see if better tolerated.       Meds ordered this encounter  Medications   nitrofurantoin , macrocrystal-monohydrate, (MACROBID ) 100 MG capsule    Sig: Take 1 capsule (100 mg total) by mouth 2 (two) times daily  for 7 days.    Dispense:  14 capsule    Refill:  0    Supervising Provider:   SEBASTIAN BEVERLEY NOVAK [[id number]]   Orders Placed This Encounter  Procedures   Urine Culture   POCT Urinalysis Dipstick (Automated)   Lab Orders         Urine Culture         POCT Urinalysis Dipstick (Automated)    No images are attached to the encounter or orders placed in the encounter.  Return if symptoms worsen or fail  to improve.   Rosina Senters, FNP

## 2024-09-08 NOTE — Telephone Encounter (Signed)
 FYI Only or Action Required?: FYI only for provider: appointment scheduled on 09/08/2024 at 1:40 PM.  Patient was last seen in primary care on 08/27/2024 by Robyn Knee, FNP.  Called Nurse Triage reporting Urinary Frequency, Urinary Urgency, and Dysuria.  Symptoms began today.  Interventions attempted: OTC medications: AZO, ibuprofen  and Rest, hydration, or home remedies.  Symptoms are: unchanged.  Triage Disposition: See HCP Within 4 Hours (Or PCP Triage)  Patient/caregiver understands and will follow disposition?: Yes  Copied from CRM #[id number]. Topic: Clinical - Red Word Triage >> Sep 08, 2024 11:38 AM Frederich Hudson wrote: [facility] that prompted transfer to Nurse Triage: pain, uti  Midnight came down with  uti, pain and stinging, taking azo and ibuprofen  since midnight. Urgency to use the rest room Reason for Disposition  [1] SEVERE pain with urination (e.g., excruciating) AND [2] not improved after 2 hours of pain medicine  Answer Assessment - Initial Assessment Questions Patient reports painful urination, urinary frequency and urgency. Symptoms started at midnight today. No fever. Patient has been medication with ibuprofen  and AZO. Patient endorses she has had three UTIs since August. Similar symptoms. Scheduled to see PCP today at 1:40 PM  1. SEVERITY: How bad is the pain?  (e.g., Scale 1-10; mild, moderate, or severe)     7 to 8 out of 10 with urination 2. FREQUENCY: How many times have you had painful urination today?      10  3. PATTERN: Is pain present every time you urinate or just sometimes?      Sometimes when AZO and ibuprofen  stop working 4. ONSET: When did the painful urination start?      Around midnight 5. FEVER: Do you have a fever? If Yes, ask: What is your temperature, how was it measured, and when did it start?     no 6. PAST UTI: Have you had a urine infection before? If Yes, ask: When was the last time? and What happened that time?      yes 7.  CAUSE: What do you think is causing the painful urination?  (e.g., UTI, scratch, Herpes sore)     UTI 8. OTHER SYMPTOMS: Do you have any other symptoms? (e.g., blood in urine, flank pain, genital sores, urgency, vaginal discharge)     Urinary urgency and frequency  Protocols used: Urination Pain - Female-A-AH

## 2024-09-09 LAB — URINE CULTURE
MICRO NUMBER:: 17244152
Result:: NO GROWTH
SPECIMEN QUALITY:: ADEQUATE

## 2024-09-10 ENCOUNTER — Ambulatory Visit: Payer: Self-pay | Admitting: Internal Medicine

## 2024-11-03 ENCOUNTER — Encounter: Payer: Self-pay | Admitting: *Deleted

## 2024-11-25 ENCOUNTER — Telehealth: Admitting: Family Medicine

## 2024-11-25 ENCOUNTER — Encounter: Payer: Self-pay | Admitting: Family Medicine

## 2024-11-25 VITALS — Ht 61.0 in | Wt 110.0 lb

## 2024-11-25 DIAGNOSIS — R300 Dysuria: Secondary | ICD-10-CM

## 2024-11-25 DIAGNOSIS — Z780 Asymptomatic menopausal state: Secondary | ICD-10-CM

## 2024-11-25 MED ORDER — NITROFURANTOIN MONOHYD MACRO 100 MG PO CAPS: 100.0000 mg | ORAL_CAPSULE | Freq: Two times a day (BID) | ORAL | 0 refills | Status: AC

## 2024-11-26 LAB — URINE CULTURE
MICRO NUMBER:: 17542744
Result:: NO GROWTH
SPECIMEN QUALITY:: ADEQUATE

## 2024-11-27 ENCOUNTER — Ambulatory Visit: Payer: Self-pay | Admitting: Family Medicine

## 2025-09-01 ENCOUNTER — Ambulatory Visit: Admitting: Internal Medicine
